# Patient Record
Sex: Male | Born: 1985 | Race: Black or African American | Hispanic: No | Marital: Married | Smoking: Current every day smoker
Health system: Southern US, Community
[De-identification: ages and names within clinical notes are randomized; demographics above are authoritative.]

## PROBLEM LIST (undated history)

## (undated) DIAGNOSIS — E079 Disorder of thyroid, unspecified: Secondary | ICD-10-CM

---
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
## 2005-05-23 ENCOUNTER — Emergency Department (HOSPITAL_COMMUNITY): Admission: EM | Admit: 2005-05-23 | Discharge: 2005-05-23 | Payer: Self-pay | Admitting: Emergency Medicine

## 2006-01-27 ENCOUNTER — Encounter: Admission: RE | Admit: 2006-01-27 | Discharge: 2006-01-27 | Payer: Self-pay | Admitting: Internal Medicine

## 2009-04-29 ENCOUNTER — Emergency Department (HOSPITAL_COMMUNITY): Admission: EM | Admit: 2009-04-29 | Discharge: 2009-04-29 | Payer: Self-pay | Admitting: Emergency Medicine

## 2010-05-25 ENCOUNTER — Observation Stay (HOSPITAL_COMMUNITY): Admission: EM | Admit: 2010-05-25 | Discharge: 2010-05-26 | Payer: Self-pay | Admitting: Emergency Medicine

## 2010-11-30 ENCOUNTER — Encounter: Payer: Self-pay | Admitting: Internal Medicine

## 2011-01-24 LAB — DIFFERENTIAL
Eosinophils Absolute: 0.2 10*3/uL (ref 0.0–0.7)
Lymphs Abs: 1.8 10*3/uL (ref 0.7–4.0)
Monocytes Relative: 5 % (ref 3–12)

## 2011-01-24 LAB — POCT I-STAT, CHEM 8
BUN: 12 mg/dL (ref 6–23)
Calcium, Ion: 1.04 mmol/L — ABNORMAL LOW (ref 1.12–1.32)
Chloride: 105 mEq/L (ref 96–112)
Creatinine, Ser: 1.3 mg/dL (ref 0.4–1.5)
Potassium: 3.5 mEq/L (ref 3.5–5.1)
TCO2: 25 mmol/L (ref 0–100)

## 2011-01-24 LAB — RAPID URINE DRUG SCREEN, HOSP PERFORMED
Amphetamines: NOT DETECTED
Benzodiazepines: NOT DETECTED
Cocaine: NOT DETECTED
Opiates: NOT DETECTED
Tetrahydrocannabinol: POSITIVE — AB

## 2011-01-24 LAB — BASIC METABOLIC PANEL
CO2: 26 mEq/L (ref 19–32)
Calcium: 8.5 mg/dL (ref 8.4–10.5)
GFR calc Af Amer: >60 mL/min (ref >60)
GFR calc non Af Amer: >60 mL/min (ref >60)
Sodium: 138 mEq/L (ref 135–145)

## 2011-01-24 LAB — CBC
Hemoglobin: 14.8 g/dL (ref 13.0–17.0)
Hemoglobin: 15.5 g/dL (ref 13.0–17.0)
RBC: 4.23 MIL/uL (ref 4.22–5.81)
RDW: 12.8 % (ref 11.5–15.5)
WBC: 6.9 10*3/uL (ref 4.0–10.5)
WBC: 7.9 10*3/uL (ref 4.0–10.5)

## 2011-01-24 LAB — PROTIME-INR: INR: 0.92 (ref 0.00–1.49)

## 2011-01-24 LAB — FOLATE RBC: RBC Folate: 624 ng/mL — ABNORMAL HIGH (ref 180–600)

## 2011-01-24 LAB — C-REACTIVE PROTEIN: CRP: 0.3 mg/dL — ABNORMAL LOW (ref <0.6)

## 2011-01-24 LAB — ANA: Anti Nuclear Antibody(ANA): NEGATIVE

## 2011-01-24 LAB — VITAMIN B12 BINDING CAPACITY, BLOOD: Vitamin B12 Bind Capacity: 1245 pg/mL (ref 650–1340)

## 2011-09-28 ENCOUNTER — Emergency Department (HOSPITAL_COMMUNITY)
Admission: EM | Admit: 2011-09-28 | Discharge: 2011-09-29 | Discharge status: Against Medical Advice | Payer: Self-pay | Attending: Emergency Medicine | Admitting: Emergency Medicine

## 2011-09-28 ENCOUNTER — Encounter: Payer: Self-pay | Admitting: *Deleted

## 2011-09-28 DIAGNOSIS — M79609 Pain in unspecified limb: Secondary | ICD-10-CM | POA: Insufficient documentation

## 2011-09-28 DIAGNOSIS — R209 Unspecified disturbances of skin sensation: Secondary | ICD-10-CM | POA: Insufficient documentation

## 2011-09-28 NOTE — ED Notes (Signed)
Pain in his lt foot and some rt sided numbness for one year that comes and goes,  He is currently having numbness only.  No headache

## 2012-01-07 ENCOUNTER — Encounter (HOSPITAL_COMMUNITY): Payer: Self-pay | Admitting: Emergency Medicine

## 2012-01-07 ENCOUNTER — Emergency Department (HOSPITAL_COMMUNITY)
Admission: EM | Admit: 2012-01-07 | Discharge: 2012-01-07 | Disposition: A | Discharge status: Home Health | Payer: Self-pay | Attending: Emergency Medicine | Admitting: Emergency Medicine

## 2012-01-07 DIAGNOSIS — F17200 Nicotine dependence, unspecified, uncomplicated: Secondary | ICD-10-CM | POA: Insufficient documentation

## 2012-01-07 DIAGNOSIS — R5381 Other malaise: Secondary | ICD-10-CM | POA: Insufficient documentation

## 2012-01-07 DIAGNOSIS — R112 Nausea with vomiting, unspecified: Secondary | ICD-10-CM | POA: Insufficient documentation

## 2012-01-07 DIAGNOSIS — K529 Noninfective gastroenteritis and colitis, unspecified: Secondary | ICD-10-CM

## 2012-01-07 DIAGNOSIS — K5289 Other specified noninfective gastroenteritis and colitis: Secondary | ICD-10-CM | POA: Insufficient documentation

## 2012-01-07 DIAGNOSIS — R197 Diarrhea, unspecified: Secondary | ICD-10-CM | POA: Insufficient documentation

## 2012-01-07 DIAGNOSIS — R5383 Other fatigue: Secondary | ICD-10-CM | POA: Insufficient documentation

## 2012-01-07 LAB — POCT I-STAT, CHEM 8
BUN: 12 mg/dL (ref 6–23)
Chloride: 102 mEq/L (ref 96–112)
HCT: 49 % (ref 39.0–52.0)
Sodium: 140 mEq/L (ref 135–145)

## 2012-01-07 LAB — URINALYSIS, ROUTINE W REFLEX MICROSCOPIC
Glucose, UA: NEGATIVE mg/dL
Specific Gravity, Urine: 1.022 (ref 1.005–1.030)
pH: 6 (ref 5.0–8.0)

## 2012-01-07 LAB — URINE MICROSCOPIC-ADD ON

## 2012-01-07 MED ORDER — SODIUM CHLORIDE 0.9 % IV BOLUS (SEPSIS)
1000.0000 mL | Freq: Once | INTRAVENOUS | Status: AC
  Administered 2012-01-07: 1000 mL via INTRAVENOUS

## 2012-01-07 MED ORDER — ONDANSETRON HCL 4 MG PO TABS: 4.0000 mg | ORAL_TABLET | Freq: Four times a day (QID) | ORAL | Status: AC

## 2012-01-07 MED ORDER — ONDANSETRON HCL 4 MG/2ML IJ SOLN
4.0000 mg | Freq: Once | INTRAMUSCULAR | Status: AC
  Administered 2012-01-07: 4 mg via INTRAVENOUS
  Filled 2012-01-07: qty 2

## 2012-01-07 NOTE — ED Provider Notes (Signed)
History     CSN: [id number]  Arrival date & time 01/07/12  1116   First MD Initiated Contact with Patient 01/07/12 1155      Chief Complaint  Patient presents with  . Emesis    (Consider location/radiation/quality/duration/timing/severity/associated sxs/prior treatment) HPI Comments: Patient presents with nausea vomiting diarrhea onset around 6 AM. She greater than 10 episodes of each. Nonbilious nonbloody. He states he ate at [employer] last night. No one else has been sick. Denies any fevers, chest pain, abdominal pain, back pain. He is not able to keep anything down today last vomited 2 hours ago. No other sick contacts.  No dizziness or lightheadedness  The history is provided by the patient.    History reviewed. No pertinent past medical history.  History reviewed. No pertinent past surgical history.  No family history on file.  History  Substance Use Topics  . Smoking status: Current Everyday Smoker  . Smokeless tobacco: Not on file  . Alcohol Use: Yes      Review of Systems  Constitutional: Positive for fatigue. Negative for fever and activity change.  HENT: Negative for congestion and rhinorrhea.   Respiratory: Negative for cough, choking and shortness of breath.   Cardiovascular: Negative for chest pain.  Gastrointestinal: Positive for nausea, vomiting and diarrhea. Negative for abdominal pain.  Genitourinary: Negative for urgency and hematuria.  Musculoskeletal: Negative for back pain.  Skin: Negative for rash.  Neurological: Positive for weakness. Negative for dizziness and headaches.    Allergies  Penicillins  Home Medications  No current outpatient prescriptions on file.  BP 134/77  Pulse 92  Temp(Src) 98.3 F (36.8 C) (Oral)  Resp 20  SpO2 93%  Physical Exam  Constitutional: He is oriented to person, place, and time. He appears well-developed and well-nourished. No distress.  HENT:  Head: Normocephalic and atraumatic.  Mouth/Throat: No  oropharyngeal exudate.  Eyes: Conjunctivae are normal. Pupils are equal, round, and reactive to light.  Neck: Normal range of motion. Neck supple.  Cardiovascular: Normal rate, regular rhythm and normal heart sounds.   Pulmonary/Chest: Effort normal and breath sounds normal. No respiratory distress.  Abdominal: Soft. There is no tenderness. There is no rebound and no guarding.  Musculoskeletal: Normal range of motion. He exhibits no edema and no tenderness.  Neurological: He is alert and oriented to person, place, and time. No cranial nerve deficit.  Skin: Skin is warm.    ED Course  Procedures (including critical care time)  Labs Reviewed  URINALYSIS, ROUTINE W REFLEX MICROSCOPIC - Abnormal; Notable for the following:    Leukocytes, UA SMALL (*)    All other components within normal limits  POCT I-STAT, CHEM 8 - Abnormal; Notable for the following:    Glucose, Bld 108 (*)    All other components within normal limits  URINE MICROSCOPIC-ADD ON   No results found.   No diagnosis found.    MDM  Nausea, vomiting, diarrhea. Vitals stable, abdomen soft, patient nontoxic appearance. Suspect viral gastroenteritis. Orthostatics positive. IV hydration, antiemetics.  Tolerating by mouth. Abdomen soft and nontender.       Glynn Octave, MD 01/07/12 1440

## 2012-01-07 NOTE — ED Notes (Signed)
Patient resting comfortably drank water without incident.

## 2012-01-07 NOTE — ED Notes (Signed)
V/d since 6 am  May have had bad mayo

## 2012-01-07 NOTE — Discharge Instructions (Signed)
B.R.A.T. Diet Your doctor has recommended the B.R.A.T. diet for you or your child until the condition improves. This is often used to help control diarrhea and vomiting symptoms. If you or your child can tolerate clear liquids, you may have:  Bananas.   Rice.   Applesauce.   Toast (and other simple starches such as crackers, potatoes, noodles).  Be sure to avoid dairy products, meats, and fatty foods until symptoms are better. Fruit juices such as apple, grape, and prune juice can make diarrhea worse. Avoid these. Continue this diet for 2 days or as instructed by your caregiver. Document Released: 10/26/2005 Document Revised: 07/08/2011 Document Reviewed: 04/14/2007 [facility] Patient Information 2012 [place], [place].Gastroparesis  Gastroparesis is also called slowed stomach emptying (delayed gastric emptying). It is a condition in which the stomach takes too long to empty its contents. It often happens in people with diabetes.  CAUSES  Gastroparesis happens when nerves to the stomach are damaged or stop working. When the nerves are damaged, the muscles of the stomach and intestines do not work normally. The movement of food is slowed or stopped. High blood glucose (sugar) causes changes in nerves and can damage the blood vessels that carry oxygen and nutrients to the nerves. RISK FACTORS  Diabetes.   Post-viral syndromes.   Eating disorders (anorexia, bulimia).   Surgery on the stomach or vagus nerve.   Gastroesophageal reflux disease (rarely).   Smooth muscle disorders (amyloidosis, scleroderma).   Metabolic disorders, including hypothyroidism.   Parkinson's disease.  SYMPTOMS   Heartburn.   Feeling sick to your stomach (nausea).   Vomiting of undigested food.   An early feeling of fullness when eating.   Weight loss.   Abdominal bloating.   Erratic blood glucose levels.   Lack of appetite.   Gastroesophageal reflux.   Spasms of the stomach wall.  Complications  can include:  Bacterial overgrowth in stomach. Food stays in the stomach and can ferment and cause bacteria to grow.   Weight loss due to difficulty digesting and absorbing nutrients.   Vomiting.   Obstruction in the stomach. Undigested food can harden and cause nausea and vomiting.   Blood glucose fluctuations caused by inconsistent food absorption.  DIAGNOSIS  The diagnosis of gastroparesis is confirmed through one or more of the following tests:  Barium X-rays and scans. These tests look at how long it takes for food to move through the stomach.   Gastric manometry. This test measures electrical and muscular activity in the stomach. A thin tube is passed down the throat into the stomach. The tube contains a wire that takes measurements of the stomach's electrical and muscular activity as it digests liquids and solid food.   Endoscopy. This procedure is done with a long, thin tube called an endoscope. It is passed through the mouth and gently guides down the esophagus into the stomach. This tube helps the caregiver look at the lining of the stomach to check for any abnormalities.   Ultrasound. This can rule out gallbladder disease or pancreatitis. This test will outline and define the shape of the gallbladder and pancreas.  TREATMENT   The primary treatment is to identify the problem and help control blood glucose levels. Treatments include:   Exercise.   Medicines to control nausea and vomiting.   Medicines to stimulate stomach muscles.   Changes in what and when you eat.   Having smaller meals more often.   Eating low-fiber forms of high-fiber foods, such aseating cooked vegetables instead  of raw vegetables.   Eating low-fat foods.   Consuming liquids, which are easier to digest.   In severe cases, feeding tubes and intravenous (IV) feeding may be needed.  It is important to note that in most cases, treatment does not cure gastroparesis. It is usually a lasting (chronic)  condition. Treatment helps you manage the condition so that you can be as healthy and comfortable as possible. NEW TREATMENTS  A gastric neurostimulator has been developed to assist people with gastroparesis. The battery-operated device is surgically implanted. It emits mild electrical pulses to help improve stomach emptying and to control nausea and vomiting.   The use of botulinum toxin has been shown to improve stomach emptying by decreasing the prolonged contractions of the muscle between the stomach and the small intestine (pyloric sphincter). The benefits are temporary.  SEEK MEDICAL CARE IF:   You are having problems keeping your blood glucose in goal range.   You are having nausea, vomiting, bloating, or early feelings of fullness with eating.   Your symptoms do not change with a change in diet.  Document Released: 10/26/2005 Document Revised: 07/08/2011 Document Reviewed: 04/04/2009 [facility] Patient Information 2012 [place], [place].

## 2012-01-07 NOTE — ED Notes (Signed)
Onset today 0400 nausea and emesis x 5-6 last consistency yellow bile.  States nothing left in stomach.  Denies any pain abdomen  Soft distended.  Bowel sounds present in all fields. Ax4.

## 2012-01-07 NOTE — ED Notes (Signed)
Resting comfortably on stretcher 2 family members at bedside.  No report of nausea.

## 2015-04-23 ENCOUNTER — Emergency Department
Admission: EM | Admit: 2015-04-23 | Discharge: 2015-04-23 | Disposition: A | Discharge status: Home / Self Care | Payer: Self-pay | Attending: Emergency Medicine | Admitting: Emergency Medicine

## 2015-04-23 ENCOUNTER — Encounter: Payer: Self-pay | Admitting: Emergency Medicine

## 2015-04-23 DIAGNOSIS — K088 Other specified disorders of teeth and supporting structures: Secondary | ICD-10-CM | POA: Insufficient documentation

## 2015-04-23 DIAGNOSIS — K029 Dental caries, unspecified: Secondary | ICD-10-CM | POA: Insufficient documentation

## 2015-04-23 DIAGNOSIS — Z720 Tobacco use: Secondary | ICD-10-CM | POA: Insufficient documentation

## 2015-04-23 DIAGNOSIS — K0889 Other specified disorders of teeth and supporting structures: Secondary | ICD-10-CM

## 2015-04-23 DIAGNOSIS — Z880 Allergy status to penicillin: Secondary | ICD-10-CM | POA: Insufficient documentation

## 2015-04-23 MED ORDER — LIDOCAINE VISCOUS 2 % MT SOLN: 20.0000 mL | OROMUCOSAL | Status: DC | PRN

## 2015-04-23 MED ORDER — IBUPROFEN 800 MG PO TABS: 800.0000 mg | ORAL_TABLET | Freq: Three times a day (TID) | ORAL | Status: DC | PRN

## 2015-04-23 MED ORDER — HYDROCODONE-ACETAMINOPHEN 5-325 MG PO TABS: 1.0000 | ORAL_TABLET | ORAL | Status: DC | PRN

## 2015-04-23 MED ORDER — SULFAMETHOXAZOLE-TRIMETHOPRIM 800-160 MG PO TABS: 1.0000 | ORAL_TABLET | Freq: Two times a day (BID) | ORAL | Status: DC

## 2015-04-23 MED ORDER — HYDROCODONE-ACETAMINOPHEN 5-325 MG PO TABS
ORAL_TABLET | ORAL | Status: AC
  Administered 2015-04-23: 2 via ORAL
  Filled 2015-04-23: qty 2

## 2015-04-23 MED ORDER — HYDROCODONE-ACETAMINOPHEN 5-325 MG PO TABS
2.0000 | ORAL_TABLET | Freq: Once | ORAL | Status: AC
  Administered 2015-04-23: 2 via ORAL

## 2015-04-23 NOTE — ED Notes (Signed)
Dental pain and gum swelling for couple of days

## 2015-04-23 NOTE — ED Provider Notes (Signed)
[facility] Emergency Department Provider Note  ____________________________________________  Time seen: Approximately 7:15 AM  I have reviewed the triage vital signs and the nursing notes.   HISTORY  Chief Complaint Dental Pain    HPI Trevor Hopkins is a 29 y.o. male presents for emergency room for evaluation of dental pain and gum swelling. Last 2-3 days. Patient states unable to see a dentist [place]. Planes of severe pain 10 over 10 his concern is having an abscess.   History reviewed. No pertinent past medical history.  There are no active problems to display for this patient.   History reviewed. No pertinent past surgical history.  Current Outpatient Rx  Name  Route  Sig  Dispense  Refill  . HYDROcodone-acetaminophen (NORCO) 5-325 MG per tablet   Oral   Take 1-2 tablets by mouth every 4 (four) hours as needed for moderate pain.   15 tablet   0   . ibuprofen (ADVIL,MOTRIN) 800 MG tablet   Oral   Take 1 tablet (800 mg total) by mouth every 8 (eight) hours as needed.   30 tablet   0   . lidocaine (XYLOCAINE) 2 % solution   Mouth/Throat   Use as directed 20 mLs in the mouth or throat as needed for mouth pain.   100 mL   0   . sulfamethoxazole-trimethoprim (BACTRIM DS,SEPTRA DS) 800-160 MG per tablet   Oral   Take 1 tablet by mouth 2 (two) times daily.   20 tablet   0     Allergies Penicillins  No family history on file.  Social History History  Substance Use Topics  . Smoking status: Current Every Day Smoker  . Smokeless tobacco: Not on file  . Alcohol Use: Yes    Review of Systems Constitutional: No fever/chills Eyes: No visual changes. ENT: No sore throat. His dental caries throughout most of the mouth. Some erythema and swelling noted to the right lower jaw. Cardiovascular: Denies chest pain. Respiratory: Denies shortness of breath. Gastrointestinal: No abdominal pain.  No nausea,  no vomiting.  No diarrhea.  No constipation. Genitourinary: Negative for dysuria. Musculoskeletal: Negative for back pain. Skin: Negative for rash. Neurological: Negative for headaches, focal weakness or numbness.  10-point ROS otherwise negative.  ____________________________________________   PHYSICAL EXAM:  VITAL SIGNS: ED Triage Vitals  Enc Vitals Group     BP 04/23/15 0710 145/80 mmHg     Pulse Rate 04/23/15 0710 74     Resp 04/23/15 0710 18     Temp 04/23/15 0710 98.9 F (37.2 C)     Temp Source 04/23/15 0710 Oral     SpO2 04/23/15 0710 98 %     Weight 04/23/15 0710 285 lb (129.275 kg)     Height 04/23/15 0710 6\' 6"  (1.981 m)     Head Cir --      Peak Flow --      Pain Score --      Pain Loc --      Pain Edu? --      Excl. in GC? --     Constitutional: Alert and oriented. Well appearing and in no acute distress. Mouth/Throat: Mucous membranes are moist.  Oropharynx non-erythematous. Obvious dental caries scattered throughout right worsen left. Some erythema and swelling noted to the right lower jaw. Neck: No stridor.   Cardiovascular: Normal rate, regular rhythm. Grossly normal heart sounds.  Good peripheral circulation. Respiratory: Normal respiratory effort.  No  retractions. Lungs CTAB. Musculoskeletal: No lower extremity tenderness nor edema.  No joint effusions. Neurologic:  Normal speech and language. No gross focal neurologic deficits are appreciated. Speech is normal. No gait instability. Skin:  Skin is warm, dry and intact. No rash noted. Psychiatric: Mood and affect are normal. Speech and behavior are normal.  ____________________________________________   LABS (all labs ordered are listed, but only abnormal results are displayed)  Labs Reviewed - No data to  display ____________________________________________  EKG  Deferred ____________________________________________  RADIOLOGY  Deferred ____________________________________________   PROCEDURES  Procedure(s) performed: None  Critical Care performed: No  ____________________________________________   INITIAL IMPRESSION / ASSESSMENT AND PLAN / ED COURSE  Pertinent labs & imaging results that were available during my care of the patient were reviewed by me and considered in my medical decision making (see chart for details).  Acute dental caries. Rx given for Bactrim DS*(secondary to penicillin allergies), ibuprofen, viscous lidocaine, and hydrocodone temporarily. Patient to follow up with list provided dentist. She voices no new emergency medical complaints at this visit and will follow up as directed. ____________________________________________   FINAL CLINICAL IMPRESSION(S) / ED DIAGNOSES  Final diagnoses:  Pain, dental      Evangeline Dakin, PA-C 04/23/15 0740  Myrna Blazer, MD 04/23/15 1420

## 2015-04-23 NOTE — Discharge Instructions (Signed)
OPTIONS FOR DENTAL FOLLOW UP CARE ° °[place] [facility] and [facility] - Local Safety Net Dental Clinics °http://www.ncdhhs.gov/dph/oralhealth/services/safetynetclinics.htm °  °[facility] (336-562-3123) ° °[facility] [place] (919-933-9087) ° °[facility] ([phone] ext [phone]) ° °[facility] ([phone]) ° °[facility] ([phone]) °This clinic caters to the indigent population and is on a lottery system. °Location: °[facility], [address], [place] °Clinic Hours: °Wednesdays from 6pm - 9pm, patients seen by a lottery system. °For dates, call or go to [email or web address] °Services: °Cleanings, fillings and simple extractions. °Payment Options: °DENTAL WORK IS FREE OF CHARGE. Bring proof of income or support. °Best way to get seen: °Arrive at 5:15 pm - this is a lottery, NOT first come/first serve, so arriving earlier will not increase your chances of being seen. °  °  °[facility] °[phone] °Select option 1 for emergencies °  °Location: °[facility], [address], [place] °Clinic Hours: °No walk-ins accepted - call the day before to schedule an appointment. °Check in times are 9:30 am and 1:30 pm. °Services: °Simple extractions, temporary fillings, pulpectomy/pulp debridement, uncomplicated abscess drainage. °Payment Options: °PAYMENT IS DUE AT THE TIME OF SERVICE.  Fee is usually $100-200, additional surgical procedures (e.g. abscess drainage) may be extra. °Cash, checks, Visa/MasterCard accepted.  Can file Medicaid if patient is covered for dental - patient should call case worker to check. °No discount for [facility] patients. °Best way to get seen: °MUST call the day before and get onto the schedule. Can usually be seen the next 1-2 days. No walk-ins accepted. °  °  °[facility] °[phone] °   °Location: °[facility], [address], [place] °Clinic Hours: °M, W, Th, F 8am or 1:30pm, Tues 9a or 1:30 - first come/first served. °Services: °Simple extractions, temporary fillings, uncomplicated abscess drainage.  You do not need to be an [place] resident. °Payment Options: °PAYMENT IS DUE AT THE TIME OF SERVICE. °Dental insurance, otherwise sliding scale - bring proof of income or support. °Depending on income and treatment needed, cost is usually $50-200. °Best way to get seen: °Arrive early as it is first come/first served. °  °  °[facility] °[phone] °  °Location: °[address] °Clinic Hours: °Mon-Thu 8a-5p °Services: °Most basic dental services including extractions and fillings. °Payment Options: °PAYMENT IS DUE AT THE TIME OF SERVICE. °Sliding scale, up to 50% off - bring proof if income or support. °Medicaid with dental option accepted. °Best way to get seen: °Call to schedule an appointment, can usually be seen within 2 weeks OR they will try to see walk-ins - show up at 8a or 2p (you may have to wait). °  °  °[facility] °919-245-2435 °[place] RESIDENTS ONLY °  °Location: °[facility], [address], [place],  [address] °Clinic Hours: By appointment only. °Monday - Thursday 8am-5pm, Friday 8am-12pm °Services: Cleanings, fillings, extractions. °Payment Options: °PAYMENT IS DUE AT THE TIME OF SERVICE. °Cash, Visa or MasterCard. Sliding scale - $30 minimum per service. °Best way to get seen: °Come in to office, complete packet and make an appointment - need proof of income °or support monies for each household member and proof of [place] residence. °Usually takes about a month to get in. °  °  °[facility] °[phone] °  °Location: °[address]   [place] °Clinic Hours: Walk-in Urgent Care Dental Services are offered Monday-Friday  mornings only. °The numbers of emergencies accepted daily is limited to the number of °providers available. °Maximum 15 - Mondays, Wednesdays & Thursdays °Maximum 10 - Tuesdays & Fridays °Services: °You do not need to be a [place] resident to be seen for a dental emergency. °Emergencies are defined as pain, swelling, abnormal bleeding, or dental trauma. Walkins will receive x-rays if needed. °NOTE: Dental cleaning is not an emergency. °Payment Options: °PAYMENT IS DUE AT THE TIME OF SERVICE. °Minimum co-pay is $40.00 for uninsured patients. °Minimum co-pay is $3.00 for Medicaid with dental coverage. °Dental Insurance is accepted and must be presented at time of visit. °Medicare does not cover dental. °Forms of payment: Cash, credit card, checks. °Best way to get seen: °If not previously registered with the clinic, walk-in dental registration begins at 7:15 am and is on a first come/first serve basis. °If previously registered with the clinic, call to make an appointment. °  °  °[facility] °[phone] °[place] RESIDENTS ONLY °  °Location: °[address], [place], [place] °Clinic Hours: °Mon-Thu 10a-2p °Services: Extractions only! °Payment Options: °FREE (donations accepted) - bring proof of income or support °Best way to get seen: °Call and schedule an appointment OR come at 8am on the 1st Monday of every month (except for holidays) when it is first come/first served. °  °  °[facility] °[phone] °  °Location: °[address], [place] °Clinic Hours: °Friday mornings °Services, Payment Options, Best way to get seen: °Call for info °Dental Pain °A tooth ache may be caused by cavities (tooth decay). Cavities expose the nerve of the tooth to air and hot or cold temperatures. It may come from an infection or abscess (also called a boil or furuncle) around your tooth. It is also often caused by dental caries (tooth decay). This causes the pain you are having. °DIAGNOSIS  °Your caregiver can  diagnose this problem by exam. °TREATMENT  °· If caused by an infection, it may be treated with medications which kill germs (antibiotics) and pain medications as prescribed by your caregiver. Take medications as directed. °· Only take over-the-counter or prescription medicines for pain, discomfort, or fever as directed by your caregiver. °· Whether the tooth ache today is caused by infection or dental disease, you should see your dentist as soon as possible for further care. °SEEK MEDICAL CARE IF: °The exam and treatment you received today has been provided on an emergency basis only. This is not a substitute for complete medical or dental care. If your problem worsens or new problems (symptoms) appear, and you are unable to meet with your dentist, call or return to this location. °SEEK IMMEDIATE MEDICAL CARE IF:  °· You have a fever. °· You develop redness and swelling of your face, jaw, or neck. °· You are unable to open your mouth. °· You have severe pain uncontrolled by pain medicine. °MAKE SURE YOU:  °· Understand these instructions. °· Will watch your condition. °· Will get help right away if you are not doing well or get worse. °Document Released: 10/26/2005 Document Revised: 01/18/2012 Document Reviewed: 06/13/2008 °[employer]® Patient Information ©2015 [employer]. This information is not intended to replace advice given to you by your health care provider. Make sure you discuss any questions you have with your health care provider. ° °

## 2015-09-02 ENCOUNTER — Encounter (HOSPITAL_COMMUNITY): Payer: Self-pay | Admitting: Emergency Medicine

## 2015-09-02 ENCOUNTER — Emergency Department (HOSPITAL_COMMUNITY)
Admission: EM | Admit: 2015-09-02 | Discharge: 2015-09-02 | Disposition: A | Discharge status: Home / Self Care | Payer: Self-pay | Attending: Emergency Medicine | Admitting: Emergency Medicine

## 2015-09-02 DIAGNOSIS — Z880 Allergy status to penicillin: Secondary | ICD-10-CM | POA: Insufficient documentation

## 2015-09-02 DIAGNOSIS — Z792 Long term (current) use of antibiotics: Secondary | ICD-10-CM | POA: Insufficient documentation

## 2015-09-02 DIAGNOSIS — Z720 Tobacco use: Secondary | ICD-10-CM | POA: Insufficient documentation

## 2015-09-02 DIAGNOSIS — K0889 Other specified disorders of teeth and supporting structures: Secondary | ICD-10-CM | POA: Insufficient documentation

## 2015-09-02 MED ORDER — IBUPROFEN 400 MG PO TABS
800.0000 mg | ORAL_TABLET | Freq: Once | ORAL | Status: AC
  Administered 2015-09-02: 800 mg via ORAL
  Filled 2015-09-02: qty 2

## 2015-09-02 NOTE — ED Provider Notes (Signed)
CSN: [id number]     Arrival date & time 09/02/15  1856 History  By signing my name below, I, Soijett Blue, attest that this documentation has been prepared under the direction and in the presence of Burna Forts, PA-C Electronically Signed: Soijett Blue, ED Scribe. 09/02/2015. 7:43 PM.   Chief Complaint  Patient presents with  . Dental Pain      The history is provided by the patient. No language interpreter was used.    Trevor Hopkins is a 29 y.o. male who presents to the Emergency Department complaining of 10/10 progressively worsening right lower dental pain onset today. He notes that he has had these symptoms before on 04/23/15 that he was seen at [facility] initially for his symptoms. He reports that he was informed that he had a dental abscess that he did not follow up with a dentist for due to his symptoms resolving. He notes that he was at work tonight when the pain began and he denies trauma/injury to the area. He states that he is having associated symptoms of facial swelling. He states that has not tried any medications for the relief for his symptoms. He denies fever, chills, and any other symptoms. Pt denies allergies to any medications.   History reviewed. No pertinent past medical history. History reviewed. No pertinent past surgical history. History reviewed. No pertinent family history. Social History  Substance Use Topics  . Smoking status: Current Every Day Smoker -- 0.50 packs/day    Types: Cigarettes  . Smokeless tobacco: None  . Alcohol Use: Yes    Review of Systems  Constitutional: Negative for fever.  HENT: Positive for dental problem and facial swelling.       Allergies  Penicillins  Home Medications   Prior to Admission medications   Medication Sig Start Date End Date Taking? Authorizing Provider  HYDROcodone-acetaminophen (NORCO) 5-325 MG per tablet Take 1-2 tablets by mouth every 4 (four) hours as needed for moderate pain. 04/23/15   Charmayne Sheer Beers,  PA-C  ibuprofen (ADVIL,MOTRIN) 800 MG tablet Take 1 tablet (800 mg total) by mouth every 8 (eight) hours as needed. 04/23/15   Charmayne Sheer Beers, PA-C  lidocaine (XYLOCAINE) 2 % solution Use as directed 20 mLs in the mouth or throat as needed for mouth pain. 04/23/15   Charmayne Sheer Beers, PA-C  sulfamethoxazole-trimethoprim (BACTRIM DS,SEPTRA DS) 800-160 MG per tablet Take 1 tablet by mouth 2 (two) times daily. 04/23/15   Charmayne Sheer Beers, PA-C   BP 155/89 mmHg  Pulse 54  Temp(Src) 98.2 F (36.8 C) (Oral)  Resp 24  Ht  (1.981 m)  Wt 285 lb (129.275 kg)  BMI 32.94 kg/m2  SpO2 100% Physical Exam  Constitutional: He is oriented to person, place, and time. He appears well-developed and well-nourished. No distress.  HENT:  Head: Normocephalic.  Mouth/Throat: Uvula is midline, oropharynx is clear and moist and mucous membranes are normal. No oropharyngeal exudate, posterior oropharyngeal edema, posterior oropharyngeal erythema or tonsillar abscesses.  External exam shows no asymmetry of the jaw line or face, no signs of obvious swelling, edema, infection. Full active range of motion of the jaw. Neck is supple with full active range of motion, no tenderness to palpation of the soft tissues  Gumline palpated no obvious signs of infection including warmth, redness, abscess, tenderness. Posterior oropharynx clear with no signs of infection, uvula is midline and rises with phonation, tonsils present and normal in size, symmetrical bilateral, tongue is normal soft touch with full  active range of motion, floor mouth is soft nontender.  Eyes: Conjunctivae are normal. Pupils are equal, round, and reactive to light. Right eye exhibits no discharge. Left eye exhibits no discharge.  Neck: Normal range of motion. Neck supple. No JVD present. No tracheal deviation present. No thyromegaly present.  Pulmonary/Chest: No stridor.  Lymphadenopathy:    He has no cervical adenopathy.  Neurological: He is alert and  oriented to person, place, and time.  Skin: Skin is warm and dry. No rash noted. He is not diaphoretic. No erythema. No pallor.  Psychiatric: He has a normal mood and affect. His behavior is normal. Judgment and thought content normal.  Nursing note and vitals reviewed.   ED Course  Procedures (including critical care time)  Periodontal Exam  NERVE BLOCK Performed by: Thermon LeylandHedges,Rual Vermeer Todd Consent: Verbal consent obtained. Required items: required blood products, implants, devices, and special equipment available Time out: Immediately prior to procedure a "time out" was called to verify the correct patient, procedure, equipment, support staff and site/side marked as required.  Indication: Dental pain  Nerve block body site: right buccal nerve   Preparation: Patient was prepped and draped in the usual sterile fashion. Needle gauge: 24 G Location technique: anatomical landmarks  Local anesthetic: Bupivacaine   Anesthetic total: 1.8 ml  Outcome: pain improved Patient tolerance: Patient tolerated the procedure well with no immediate complications.  DIAGNOSTIC STUDIES: Oxygen Saturation is 97% on RA, nl by my interpretation.    COORDINATION OF CARE: 7:41 PM Discussed treatment plan with pt at bedside which includes dental block, f/u and referral to dentist, and use tylenol/ibuprofen PRN and pt agreed to plan.    Labs Review Labs Reviewed - No data to display  Imaging Review No results found. I have personally reviewed and evaluated these images and lab results as part of my medical decision-making.   EKG Interpretation None      MDM   Final diagnoses:  Pain, dental    Labs:   Imaging:   Consults:   Therapeutics: dental block  Discharge Meds:   Assessment/Plan: Uncomplicated dental pain. Symptoms resolved with dental block. Use ibuprofen/tylenol PRN for pain and referral and f/u with dentist. Return precautions given.    I personally performed the  services described in this documentation, which was scribed in my presence. The recorded information has been reviewed and is accurate.    Eyvonne MechanicJeffrey Burdette Gergely, PA-C 09/04/15 1526  Elwin MochaBlair Walden, MD 09/05/15 [phone]

## 2015-09-02 NOTE — ED Notes (Signed)
Pt c/o dental pain 10/10 on his right lower side, right side on his face looks swollen on assessment, pt denies having fever.

## 2015-09-02 NOTE — Discharge Instructions (Signed)
°Emergency Department Resource Guide °1) Find a Doctor and Pay Out of Pocket °Although you won't have to find out who is covered by your insurance plan, it is a good idea to ask around and get recommendations. You will then need to call the office and see if the doctor you have chosen will accept you as a new patient and what types of options they offer for patients who are self-pay. Some doctors offer discounts or will set up payment plans for their patients who do not have insurance, but you will need to ask so you aren't surprised when you get to your appointment. ° °2) Contact Your Local Health Department °Not all health departments have doctors that can see patients for sick visits, but many do, so it is worth a call to see if yours does. If you don't know where your local health department is, you can check in your phone book. The CDC also has a tool to help you locate your state's health department, and many state websites also have listings of all of their local health departments. ° °3) Find a Walk-in Clinic °If your illness is not likely to be very severe or complicated, you may want to try a walk in clinic. These are popping up all over the country in pharmacies, drugstores, and shopping centers. They're usually staffed by nurse practitioners or physician assistants that have been trained to treat common illnesses and complaints. They're usually fairly quick and inexpensive. However, if you have serious medical issues or chronic medical problems, these are probably not your best option. ° °No Primary Care Doctor: °- Call [facility] at  832-8000 - they can help you locate a primary care doctor that  accepts your insurance, provides certain services, etc. °- Physician Referral Service- 1-800-533-3463 ° °Chronic Pain Problems: °Organization         Address  Phone   Notes  ° [facility]  (336) 297-2271 Patients need to be referred by their primary care doctor.  ° °Medication  Assistance: °Organization         Address  Phone   Notes  °[place] [facility] [address] °[place], [place] [address] (336) 641-8030 --Must be a resident of [place] °-- Must have NO insurance coverage whatsoever (no Medicaid/ Medicare, etc.) °-- The pt. MUST have a primary care doctor that directs their care regularly and follows them in the community °  °[facility]  (866) 331-1348   °[employer] Way  (888) 892-1162   ° °Agencies that provide inexpensive medical care: °Organization         Address  Phone   Notes  °[facility]  (336) 832-8035   °[facility]    (336) 832-7272   °[facility] [address] °[place], [place] [address] (336) 832-4777   °[facility] [address], °[place] (336) 271-4999   °[facility]    (336) 373-0678   °[facility]    (336) 272-1050   °[facility] ° [address], [place] Phone:  (336) 832-4444, Fax:  (336) 832-4440 Hours of Operation:  9 am - 6 pm, M-F.  Also accepts Medicaid/Medicare and self-pay.  °[facility] ° [address], [place] Phone: (336) 832-3150, Fax: (336) 832-3151. Hours of Operation:  8:30 am - 5:30 pm, M-F.  Also accepts Medicaid and self-pay.  °[facility] [place] [address], [place] Phone: (336) 878-6027   °[facility] [address], [place], [place] (336)723-1848, Ext. 123 Mondays & Thursdays: 7-9 AM.  First 15 patients are seen on a first come, first serve basis. °  ° °Medicaid-accepting [place] Providers: ° °Organization         Address  Phone   Notes  °[facility] [address], [place] (336) 641-2100 Also accepts self-pay patients.  °[facility] [address], [place] ° (336) 856-9996   °[facility] [address], [place]  (336) 288-8857   °[facility] [address] [place] [address], [place] (336) 299-7000   °Ve[facility] [address], [place]  ° (336) 373-1557 Only accepts [place] Access Medicaid patients after they have their name applied to their card.  ° °Self-Pay (no insurance) in [place]: ° °Organization         Address  Phone   Notes  °Sickle Cell Patients, [facility] [address], [place] (336) 832-1970   °[facility] [address], [place] (336) 832-4400   °[facility] ° [address], [place] (336) 992-4800   °[facility]/Dr. Osei-Bonsu ° [address] [place] [address], [place] or [address], [place] (336) 841-8500 Phone number for both [place] and [place] locations is the same.  °[facility] [address], [place] (336) 299-0000   °[facility] [address] [place] [address], [place] or [address] (336) 852-7530 °(336) 878-2260   °[facility] [address], [place] (336) 350-1642, phone; (336) 294-5005, fax Sees patients 1st and 3rd Saturday of every month.  Must not qualify for public or private insurance (i.e. Medicaid, Medicare, [place] Health Choice, Veterans' Benefits) • Household income should be no more than 200% of the poverty level •The clinic cannot treat you if you are pregnant or think you are pregnant • Sexually transmitted diseases are not treated at the clinic.  ° ° °Dental Care: °Organization         Address  Phone  Notes  °[place] [facility] [address], [place] (336) 641-6152 Accepts children up to age 21 who are enrolled in Medicaid or [place] Health Choice; pregnant women with a Medicaid card; and children who have applied for Medicaid or [place] Health Choice, but were declined, whose parents can pay a reduced fee at time of service.  °[place]  [facility] [place]  [address], [place] (336) 641-7733 Accepts children up to age 21 who are enrolled in Medicaid or [place] Health Choice; pregnant women with a Medicaid card; and children who have applied for Medicaid or [place] Health Choice, but were declined, whose parents can pay a reduced fee at time of service.  °[facility] ° [address],  (336) 641-4533 Patients are seen by appointment only. Walk-ins are not accepted. [facility] will see patients 18 years of age and older. °Monday - Tuesday (8am-5pm) °Most Wednesdays (8:30-5pm) °$30 per visit, cash only  °[facility] ° [address], [place] (336) 641-4533 Patients are seen by appointment only. Walk-ins are not accepted. [facility] will see patients 18 years of age and older. °One   Wednesday Evening (Monthly: Volunteer Based).  $30 per visit, cash only  °[facility]  (919) 537-3737 for adults; Children under age 4, call Graduate Pediatric Dentistry at (919) 537-3956. Children aged 4-14, please call (919) 537-3737 to request a pediatric application. ° Dental services are provided in all areas of dental care including fillings, crowns and bridges, complete and partial dentures, implants, gum treatment, root canals, and extractions. Preventive care is also provided. Treatment is provided to both adults and children. °Patients are selected via a lottery and there is often a waiting list. °  °[facility] [address], °[place] ° (336) 763-8833 [email or web address] °  °[facility] [address], [place], [place] (336)723-1848, Ext. 123 Second and Fourth Thursday of each month, opens at 6:30 AM; Clinic ends at 9 AM.  Patients are seen on a first-come first-served basis, and a limited number are seen during each clinic.  ° °[facility] ° [address], [place], [place] (336) 723-7904    Eligibility Requirements °You must have lived in [place], [place], or [place] counties for at least the last three months. °  You cannot be eligible for state or federal sponsored healthcare insurance, including Veterans Administration, Medicaid, or Medicare. °  You generally cannot be eligible for healthcare insurance through your employer.  °  How to apply: °Eligibility screenings are held every Tuesday and Wednesday afternoon from 1:00 pm until 4:00 pm. You do not need an appointment for the interview!  °[facility] [address], [place], [place] 336-631-2330   °[facility]  336-342-8273   °[facility]  336-703-3100   °[facility]  336-570-6415   ° °Behavioral Health Resources in the Community: °Intensive Outpatient Programs °Organization         Address  Phone  Notes  °[facility] [address], [place], [place] 336-878-6098   °[facility] [address], [place], [place] 336-832-9800   °[facility] [address], [place], [place] ° 336-882-2125   °[facility] [address],  °[place], [place] 1-800-853-5163 or 336-641-4981   °Substance Abuse Resources °Organization         Address  Phone  Notes  °[facility]  336-882-2125   °[facility]  336-784-9470   °[facility]  336-285-9073   °[facility]  336-845-3988   °Residential & Outpatient Substance Abuse Program  1-800-659-3381   °Psychological Services °Organization         Address  Phone  Notes  °[facility]  336- 832-9600   °[facility]  336- 378-7881   °[facility] [address], [place] 1-800-853-5163 or 336-641-4981   ° °Mobile Crisis Teams °Organization         Address  Phone  Notes  °[facility], Mobile Crisis Care Unit  1-877-626-1772   °Assertive °[facility] ° [address]  [place], [place] 336-834-9664   °Sharon DeEsch [address] °[place] 336-554-5454   ° °Self-Help/Support Groups °Organization         Address  Phone             Notes  °[facility] [place] - variety of support groups  336- 373-1402 Call for more information  °Narcotics Anonymous (NA), [facility] [address], °[place]  2 meetings at this location  ° °  Residential Treatment Programs °Organization         Address  Phone  Notes  °[facility] [address],    °[place]  1-866-801-8205   °[facility] ° [address], [place], [place] 704-293-8524   °[facility] [address], [place] 336-845-3988 Admissions: 8am-3pm M-F  °[facility] [address]    °[place], [place] 336-841-1104   °[facility] [address], [place], [place] 336-379-7146   °[facility] [address]  °[place], [place] 336-285-9073   °[facility] - Intensive Outpatient [address], [place], [place] 336-852-3033   °[facility] [address]  °[place], [place] 1-877-615-2722 or 336-784-9470   °[facility] [address] [place], [place] 336-227-7417 Accepts Medicaid  °[facility] [address]  °[place] 1-800-659-3381 Substance Abuse/Addiction Treatment  ° °[place] Behavioral Health Resources °Organization         Address  Phone  Notes  °[facility]  (888) 581-9988   °Julie Brannon, PhD [address] [place], [place]   (336) 349-5553 or (336) 951-0000   °[facility]   [address] °[place], [place] (336) 349-4454   °[facility] [address], [place], [place] (336) 342-8316 Insurance/Medicaid/sponsorship through [facility]  °[facility] [address]                                    [place], [place] (336) 342-8316 Therapy/tele-psych/case    °[facility] [address]  ° [place], [place] (336) 349-2233    °Dr. Arfeen  (336) 349-4544   °[facility] [place]  [employer] [place] [facility] 1) [address], [place] °2) [address], [place] °3)  [address], [place] (336) 349-3220 °(336) 342-7768 ° °(336) 342-8140   °[place] Child Abuse [facility] (336) 342-1394 or (336) 342-3537 (After Hours)    ° ° °

## 2016-01-28 ENCOUNTER — Encounter (HOSPITAL_COMMUNITY): Payer: Self-pay | Admitting: Emergency Medicine

## 2016-01-28 ENCOUNTER — Emergency Department (HOSPITAL_COMMUNITY)
Admission: EM | Admit: 2016-01-28 | Discharge: 2016-01-28 | Disposition: A | Discharge status: Home / Self Care | Payer: Self-pay | Attending: Emergency Medicine | Admitting: Emergency Medicine

## 2016-01-28 DIAGNOSIS — Z79899 Other long term (current) drug therapy: Secondary | ICD-10-CM | POA: Insufficient documentation

## 2016-01-28 DIAGNOSIS — K121 Other forms of stomatitis: Secondary | ICD-10-CM | POA: Insufficient documentation

## 2016-01-28 DIAGNOSIS — F17210 Nicotine dependence, cigarettes, uncomplicated: Secondary | ICD-10-CM | POA: Insufficient documentation

## 2016-01-28 DIAGNOSIS — Z791 Long term (current) use of non-steroidal anti-inflammatories (NSAID): Secondary | ICD-10-CM | POA: Insufficient documentation

## 2016-01-28 DIAGNOSIS — Z880 Allergy status to penicillin: Secondary | ICD-10-CM | POA: Insufficient documentation

## 2016-01-28 DIAGNOSIS — Z8639 Personal history of other endocrine, nutritional and metabolic disease: Secondary | ICD-10-CM | POA: Insufficient documentation

## 2016-01-28 HISTORY — DX: Disorder of thyroid, unspecified: E07.9

## 2016-01-28 MED ORDER — MORPHINE SULFATE 15 MG PO TABS: 15.0000 mg | ORAL_TABLET | ORAL | Status: DC | PRN

## 2016-01-28 MED ORDER — NAPROXEN 375 MG PO TABS: 375.0000 mg | ORAL_TABLET | Freq: Two times a day (BID) | ORAL | Status: DC

## 2016-01-28 MED ORDER — MAGIC MOUTHWASH W/LIDOCAINE: 5.0000 mL | Freq: Three times a day (TID) | ORAL | Status: DC

## 2016-01-28 NOTE — ED Provider Notes (Signed)
CSN: [id number]     Arrival date & time 01/28/16  [id number] History  By signing my name below, I, [name], attest that this documentation has been prepared under the direction and in the presence of non-physician practitioner, Arthor CaptainAbigail Kinslea Frances, PA-C. Electronically Signed: [name], Scribe. 01/28/2016. 9:48 AM.    Chief Complaint  Patient presents with  . Dental Pain   The history is provided by the patient. No language interpreter was used.    HPI Comments:  Trevor Hopkins is a 30 y.o. male who presents to the Emergency Department complaining of 8/10 left lower and upper dental pain with associated surrounding swelling x 3 days. He reports greater pain to the left upper gumline.  He has been applying ice to the site with mild relief. He was seen by a dentist ~ 1 year ago for similar pain on the right side. He was told he needed to have a tooth extracted but did not follow up. Pt is a current everyday smoker.    Past Medical History  Diagnosis Date  . Thyroid disease    History reviewed. No pertinent past surgical history. No family history on file. Social History  Substance Use Topics  . Smoking status: Current Every Day Smoker -- 0.50 packs/day    Types: Cigarettes  . Smokeless tobacco: None  . Alcohol Use: Yes    Review of Systems  Constitutional: Negative for fever and chills.  HENT: Positive for dental problem and facial swelling.     Allergies  Penicillins  Home Medications   Prior to Admission medications   Medication Sig Start Date End Date Taking? Authorizing Provider  HYDROcodone-acetaminophen (NORCO) 5-325 MG per tablet Take 1-2 tablets by mouth every 4 (four) hours as needed for moderate pain. 04/23/15   Charmayne Sheerharles M Beers, PA-C  ibuprofen (ADVIL,MOTRIN) 800 MG tablet Take 1 tablet (800 mg total) by mouth every 8 (eight) hours as needed. 04/23/15   Charmayne Sheerharles M Beers, PA-C  lidocaine (XYLOCAINE) 2 % solution Use as directed 20 mLs in the mouth or throat as needed  for mouth pain. 04/23/15   Evangeline Dakinharles M Beers, PA-C  magic mouthwash w/lidocaine SOLN Take 5 mLs by mouth 3 (three) times daily. Swish and swallow. 01/28/16   Arthor CaptainAbigail Harsimran Westman, PA-C  morphine (MSIR) 15 MG tablet Take 1 tablet (15 mg total) by mouth every 4 (four) hours as needed for severe pain. 01/28/16   Arthor CaptainAbigail Aaralyn Kil, PA-C  naproxen (NAPROSYN) 375 MG tablet Take 1 tablet (375 mg total) by mouth 2 (two) times daily. 01/28/16   Arthor CaptainAbigail Noella Kipnis, PA-C  sulfamethoxazole-trimethoprim (BACTRIM DS,SEPTRA DS) 800-160 MG per tablet Take 1 tablet by mouth 2 (two) times daily. 04/23/15   Charmayne Sheerharles M Beers, PA-C   BP 153/88 mmHg  Pulse 71  Temp(Src) 97.3 F (36.3 C) (Oral)  Resp 18  SpO2 96% Physical Exam  Constitutional: He is oriented to person, place, and time. He appears well-developed and well-nourished. No distress.  HENT:  Head: Normocephalic and atraumatic.  Left upper apex of gumline is erythematous and irritated; no swelling   Eyes: Conjunctivae are normal.  Cardiovascular: Normal rate.   Pulmonary/Chest: Effort normal.  Abdominal: He exhibits no distension.  Neurological: He is alert and oriented to person, place, and time.  Skin: Skin is warm and dry.  Psychiatric: He has a normal mood and affect.  Nursing note and vitals reviewed.    ED Course  Procedures   DIAGNOSTIC STUDIES:  Oxygen Saturation is 96% on RA, normal  by my interpretation.    COORDINATION OF CARE:  9:46 AM Discussed treatment plan with pt at bedside and pt agreed to plan.   MDM   Patient with mucositis.  No abscess requiring immediate incision and drainage.  Exam not concerning for Ludwig's angina or pharyngeal abscess.  Will treat with Magic mouthwash and a small amount of pain medication. Pt instructed to follow-up with dentist.  Discussed return precautions. Pt safe for discharge.  Final diagnoses:  Recurrent stomatitis      I personally performed the services described in this documentation, which was  scribed in my presence. The recorded information has been reviewed and is accurate.       Arthor Captain, PA-C 01/29/16 1647  Laurence Spates, MD 01/30/16 [phone]

## 2016-01-28 NOTE — ED Notes (Signed)
Pt report left side mouth pain x 3 days with worsening swelling. Pt alert x4. NAD at this time.

## 2016-01-28 NOTE — Discharge Instructions (Signed)
Stomatitis Stomatitis is a condition that causes inflammation in your mouth. It can affect a part of your mouth or your whole mouth. The condition often affects your cheek, teeth, gums, lips, and tongue. Stomatitis can also affect the mucous membranes that surround your mouth (mucosa). Pain from stomatitis can make it hard for you to eat or drink. Severe cases of this condition can lead to dehydration or poor nutrition. CAUSES Common causes of this condition include:  Viruses, such as cold sores or oral herpes and shingles.  Canker sores.  Bacterial infections.  Fungus or yeast infections, such as oral thrush.  Not getting adequate nutrition.  Injury to your mouth. This can be from:  Dentures or braces that do not fit well.  Biting your tongue or cheek.  Burning your mouth.  Having sharp or broken teeth.  Gum disease.  Using tobacco, especially chewing tobacco.  Allergies to foods, medicines, or substances that are used in your mouth.  Medicines, including cancer medicines (chemotherapy), antihistamines, and seizure medicines. In some cases, the cause may not be known. RISK FACTORS This condition is more likely to develop in people who:  Have poor oral hygiene or poor nutrition.  Have any condition that causes a dry mouth.  Are under a lot of physical or emotional stress.  Have any condition that weakens the body's defense system (immune system).  Are being treated for cancer.  Smoke. SYMPTOMS The most common symptoms of this condition are pain, swelling, and redness inside your mouth. The pain may feel like burning or stinging. It may get worse from eating or drinking. Other symptoms include:  Painful, shallow sores (ulcers) in the mouth.  Blisters in the mouth.  Bleeding gums.  Swollen gums.  Irritability and fatigue.  Bad breath.  Bad taste in the mouth.  Fever. DIAGNOSIS This condition is diagnosed with a physical exam to check for bleeding gums  and mouth ulcers. You may also have other tests, including:  Blood tests to look for infection or vitamin deficiencies.  Mouth swab to get a fluid sample to test for bacteria (culture).  Tissue sample from an ulcer to examine under a microscope (biopsy). TREATMENT Treatment for stomatitis depends on the cause. Treatment may include medicines, such as:  Over-the counter (OTC) pain medicines.  Topical anesthetic to numb the area if you have severe pain.  Antibiotics to treat a bacterial infection.  Antifungals to treat a fungal infection.  Antivirals to treat a viral infection.  Mouth rinses that contain steroids to reduce the swelling in your mouth.  Other medicines to coat or numb your mouth. HOME CARE INSTRUCTIONS Medicines  Take medicines only as directed by your health care provider.  If you were prescribed an antibiotic, finish all of it even if you start to feel better. Lifestyle  Practice good oral hygiene:  Gently brush your teeth with a soft, nylon-bristled toothbrush two times each day.  Floss your teeth every day.  Have your teeth cleaned regularly, as recommended by your dentist.  Eat a balanced diet.Do not eat:  Spicy foods.  Citrus, such as oranges.  Foods that have sharp edges, such as chips.  Avoid any foods or other allergens that you think may be causing your stomatitis.  If you have dentures, make sure that they are properly fitted.  Do not use any tobacco products, including cigarettes, chewing tobacco, or electronic cigarettes. If you need help quitting, ask your health care provider.  Find ways to reduce stress. Try yoga  or meditation. Ask your health care provider for other ideas. General Instructions  Use a salt-water rinse for pain as directed by your health care provider. Mix 1 tsp of salt in 2 cups of water.  Drink enough fluid to keep your urine clear or pale yellow. This will keep you hydrated. SEEK MEDICAL CARE IF:  Your  symptoms get worse.  You develop new symptoms, especially:  A rash.  New symptoms that do not involve your mouth area.  Your symptoms last longer than three weeks.  Your stomatitis goes away and then returns.  You have a harder time eating and drinking normally.  You have increasing fatigue or weakness.  You lose your appetite or you feel nauseous.  You have a fever.   This information is not intended to replace advice given to you by your health care provider. Make sure you discuss any questions you have with your health care provider.   Document Released: 08/23/2007 Document Revised: 03/12/2015 Document Reviewed: 10/22/2014 Elsevier Interactive Patient Education 2016 Elsevier Inc.  Dental Care and Dentist Visits Dental care supports good overall health. Regular dental visits can also help you avoid dental pain, bleeding, infection, and other more serious health problems in the future. It is important to keep the mouth healthy because diseases in the teeth, gums, and other oral tissues can spread to other areas of the body. Some problems, such as diabetes, heart disease, and pre-term labor have been associated with poor oral health.  See your dentist every 6 months. If you experience emergency problems such as a toothache or broken tooth, go to the dentist right away. If you see your dentist regularly, you may catch problems early. It is easier to be treated for problems in the early stages.  WHAT TO EXPECT AT A DENTIST VISIT  Your dentist will look for many common oral health problems and recommend proper treatment. At your regular dental visit, you can expect:  Gentle cleaning of the teeth and gums. This includes scraping and polishing. This helps to remove the sticky substance around the teeth and gums (plaque). Plaque forms in the mouth shortly after eating. Over time, plaque hardens on the teeth as tartar. If tartar is not removed regularly, it can cause problems. Cleaning also  helps remove stains.  Periodic X-rays. These pictures of the teeth and supporting bone will help your dentist assess the health of your teeth.  Periodic fluoride treatments. Fluoride is a natural mineral shown to help strengthen teeth. Fluoride treatmentinvolves applying a fluoride gel or varnish to the teeth. It is most commonly done in children.  Examination of the mouth, tongue, jaws, teeth, and gums to look for any oral health problems, such as:  Cavities (dental caries). This is decay on the tooth caused by plaque, sugar, and acid in the mouth. It is best to catch a cavity when it is small.  Inflammation of the gums caused by plaque buildup (gingivitis).  Problems with the mouth or malformed or misaligned teeth.  Oral cancer or other diseases of the soft tissues or jaws. KEEP YOUR TEETH AND GUMS HEALTHY For healthy teeth and gums, follow these general guidelines as well as your dentist's specific advice:  Have your teeth professionally cleaned at the dentist every 6 months.  Brush twice daily with a fluoride toothpaste.  Floss your teeth daily.  Ask your dentist if you need fluoride supplements, treatments, or fluoride toothpaste.  Eat a healthy diet. Reduce foods and drinks with added sugar.  Avoid  smoking. TREATMENT FOR ORAL HEALTH PROBLEMS If you have oral health problems, treatment varies depending on the conditions present in your teeth and gums.  Your caregiver will most likely recommend good oral hygiene at each visit.  For cavities, gingivitis, or other oral health disease, your caregiver will perform a procedure to treat the problem. This is typically done at a separate appointment. Sometimes your caregiver will refer you to another dental specialist for specific tooth problems or for surgery. SEEK IMMEDIATE DENTAL CARE IF:  You have pain, bleeding, or soreness in the gum, tooth, jaw, or mouth area.  A permanent tooth becomes loose or separated from the gum  socket.  You experience a blow or injury to the mouth or jaw area.   This information is not intended to replace advice given to you by your health care provider. Make sure you discuss any questions you have with your health care provider.   Document Released: 07/08/2011 Document Revised: 01/18/2012 Document Reviewed: 07/08/2011 Elsevier Interactive Patient Education 2016 Elsevier Inc.  [facility]  [employer] [facility] [employer]  Community Service Learning [facility]  [address]  [place], [place] [address]  Phone [phone]  The [employer] [facility] [place] exemplifies the [employer] vision to improve the health and quality of life of all [place] by Public house managercreating leaders with a passion to care for the underserved and by leading the nation in community-based, service learning oral health education. We are committed to offering comprehensive general dental services for adults, children and special needs patients in a safe, caring and professional setting.  Appointments: Our clinic is open Monday through Friday 8:00 a.m. until 5:00 p.m. The amount of time scheduled for an appointment depends on the patients specific needs. We ask that you keep your appointed time for care or provide 24-hour notice of all appointment changes. Parents or legal guardians must accompany minor children.  Payment for Services: Medicaid and other insurance plans are welcome. Payment for services is due when services are rendered and may be made by cash or credit card. If you have dental insurance, we will assist you with your claim submission.   Emergencies: Emergency services will be provided Monday through Friday on a walk-in basis. Please arrive early for emergency services. After hours emergency services will be provided for patients of record as required.  Services:  [facility]  Childrens Dentistry  Oral Surgery - Extractions  Root Canals  Sealants and Tooth Colored Fillings  Crowns and Bridges  Dentures and Partial Dentures  Implant Services  Periodontal Services and Cleanings  Cosmetic Building services engineerTooth Whitening  Digital Radiography  3-D/[facility] Beam Imaging

## 2016-04-02 ENCOUNTER — Encounter (HOSPITAL_COMMUNITY): Payer: Self-pay | Admitting: Emergency Medicine

## 2016-04-02 ENCOUNTER — Emergency Department (HOSPITAL_COMMUNITY)
Admission: EM | Admit: 2016-04-02 | Discharge: 2016-04-02 | Disposition: A | Discharge status: Home / Self Care | Payer: Self-pay | Attending: Emergency Medicine | Admitting: Emergency Medicine

## 2016-04-02 DIAGNOSIS — K029 Dental caries, unspecified: Secondary | ICD-10-CM | POA: Insufficient documentation

## 2016-04-02 DIAGNOSIS — Z79899 Other long term (current) drug therapy: Secondary | ICD-10-CM | POA: Insufficient documentation

## 2016-04-02 DIAGNOSIS — T85848A Pain due to other internal prosthetic devices, implants and grafts, initial encounter: Secondary | ICD-10-CM

## 2016-04-02 DIAGNOSIS — T8484XA Pain due to internal orthopedic prosthetic devices, implants and grafts, initial encounter: Secondary | ICD-10-CM | POA: Insufficient documentation

## 2016-04-02 DIAGNOSIS — Z791 Long term (current) use of non-steroidal anti-inflammatories (NSAID): Secondary | ICD-10-CM | POA: Insufficient documentation

## 2016-04-02 DIAGNOSIS — Y658 Other specified misadventures during surgical and medical care: Secondary | ICD-10-CM | POA: Insufficient documentation

## 2016-04-02 DIAGNOSIS — Z8639 Personal history of other endocrine, nutritional and metabolic disease: Secondary | ICD-10-CM | POA: Insufficient documentation

## 2016-04-02 DIAGNOSIS — Z880 Allergy status to penicillin: Secondary | ICD-10-CM | POA: Insufficient documentation

## 2016-04-02 DIAGNOSIS — Z792 Long term (current) use of antibiotics: Secondary | ICD-10-CM | POA: Insufficient documentation

## 2016-04-02 DIAGNOSIS — F17210 Nicotine dependence, cigarettes, uncomplicated: Secondary | ICD-10-CM | POA: Insufficient documentation

## 2016-04-02 DIAGNOSIS — K0381 Cracked tooth: Secondary | ICD-10-CM | POA: Insufficient documentation

## 2016-04-02 MED ORDER — OXYCODONE HCL 5 MG PO TABS
10.0000 mg | ORAL_TABLET | Freq: Once | ORAL | Status: AC
  Administered 2016-04-02: 10 mg via ORAL
  Filled 2016-04-02: qty 2

## 2016-04-02 MED ORDER — TRAMADOL HCL 50 MG PO TABS: 50.0000 mg | ORAL_TABLET | Freq: Two times a day (BID) | ORAL | Status: DC | PRN

## 2016-04-02 NOTE — Discharge Instructions (Signed)
Dental Pain Trevor Hopkins, see your dentist within 3 days for close follow up of your teeth.  Continue tylenol or ibuprofen at home for pain control.  You can take tramadol for severe pain.  Come back to the ED immediately for any worsening symptoms.  Thank you. Dental pain may be caused by many things, including:  Tooth decay (cavities or caries). Cavities cause the nerve of your tooth to be open to air and hot or cold temperatures. This can cause pain or discomfort.  Abscess or infection. A dental abscess is an area that is full of infected pus from a bacterial infection in the inner part of the tooth (pulp). It usually happens at the end of the tooth's root.  Injury.  An unknown reason (idiopathic). Your pain may be mild or severe. It may only happen when:  You are chewing.  You are exposed to hot or cold temperature.  You are eating or drinking sugary foods or beverages, such as:  Soda.  Candy. Your pain may also be there all of the time. HOME CARE Watch your dental pain for any changes. Do these things to lessen your discomfort:  Take medicines only as told by your dentist.  If your dentist tells you to take an antibiotic medicine, finish all of it even if you start to feel better.  Keep all follow-up visits as told by your dentist. This is important.  Do not apply heat to the outside of your face.  Rinse your mouth or gargle with salt water if told by your dentist. This helps with pain and swelling.  You can make salt water by adding  tsp of salt to 1 cup of warm water.  Apply ice to the painful area of your face:  Put ice in a plastic bag.  Place a towel between your skin and the bag.  Leave the ice on for 20 minutes, 2-3 times per day.  Avoid foods or drinks that cause you pain, such as:  Very hot or very cold foods or drinks.  Sweet or sugary foods or drinks. GET HELP IF:  Your pain is not helped with medicines.  Your symptoms are worse.  You have new  symptoms. GET HELP RIGHT AWAY IF:  You cannot open your mouth.  You are having trouble breathing or swallowing.  You have a fever.  Your face, neck, or jaw is puffy (swollen).   This information is not intended to replace advice given to you by your health care provider. Make sure you discuss any questions you have with your health care provider.   Document Released: 04/13/2008 Document Revised: 03/12/2015 Document Reviewed: 10/22/2014 Elsevier Interactive Patient Education [employer]2016 Elsevier Inc.

## 2016-04-02 NOTE — ED Notes (Addendum)
Pt. reports worsening right lower molar pain onset this week unrelieved by OTC Ibuprofen .

## 2016-04-02 NOTE — ED Provider Notes (Signed)
CSN: [id number]     Arrival date & time 04/02/16  0319 History  By signing my name below, I, Bethel BornBritney McCollum, attest that this documentation has been prepared under the direction and in the presence of Tomasita CrumbleAdeleke Ryla Cauthon, MD. Electronically Signed: Bethel BornBritney McCollum, ED Scribe. 04/02/2016. 3:48 AM   Chief Complaint  Patient presents with  . Dental Pain    The history is provided by the patient. No language interpreter was used.   Trevor Hopkins is a 30 y.o. male who presents to the Emergency Department complaining of constant, progressively worsening, 10/10 in severity, right lower dental pain with onset yesterday. Pt states that it feels as if the tooth is loose. Ibuprofen provided insufficient pain relief at home. He had similar pain at a different tooth last month that improved with abx.  Pt denies fever, chills, and difficulty breathing or swallowing. He does have a dentist to follow up with.   Past Medical History  Diagnosis Date  . Thyroid disease    History reviewed. No pertinent past surgical history. No family history on file. Social History  Substance Use Topics  . Smoking status: Current Every Day Smoker -- 0.00 packs/day    Types: Cigarettes  . Smokeless tobacco: None  . Alcohol Use: Yes    Review of Systems  10 Systems reviewed and all are negative for acute change except as noted in the HPI.  Allergies  Penicillins  Home Medications   Prior to Admission medications   Medication Sig Start Date End Date Taking? Authorizing Provider  HYDROcodone-acetaminophen (NORCO) 5-325 MG per tablet Take 1-2 tablets by mouth every 4 (four) hours as needed for moderate pain. 04/23/15   Charmayne Sheerharles M Beers, PA-C  ibuprofen (ADVIL,MOTRIN) 800 MG tablet Take 1 tablet (800 mg total) by mouth every 8 (eight) hours as needed. 04/23/15   Charmayne Sheerharles M Beers, PA-C  lidocaine (XYLOCAINE) 2 % solution Use as directed 20 mLs in the mouth or throat as needed for mouth pain. 04/23/15   Evangeline Dakinharles M Beers, PA-C   magic mouthwash w/lidocaine SOLN Take 5 mLs by mouth 3 (three) times daily. Swish and swallow. 01/28/16   Arthor CaptainAbigail Harris, PA-C  morphine (MSIR) 15 MG tablet Take 1 tablet (15 mg total) by mouth every 4 (four) hours as needed for severe pain. 01/28/16   Arthor CaptainAbigail Harris, PA-C  naproxen (NAPROSYN) 375 MG tablet Take 1 tablet (375 mg total) by mouth 2 (two) times daily. 01/28/16   Arthor CaptainAbigail Harris, PA-C  sulfamethoxazole-trimethoprim (BACTRIM DS,SEPTRA DS) 800-160 MG per tablet Take 1 tablet by mouth 2 (two) times daily. 04/23/15   Charmayne Sheerharles M Beers, PA-C   BP 148/82 mmHg  Pulse 92  Temp(Src) 97.6 F (36.4 C) (Oral)  Resp 18  Wt 280 lb (127.007 kg)  SpO2 100% Physical Exam  Constitutional: He is oriented to person, place, and time. Vital signs are normal. He appears well-developed and well-nourished.  Non-toxic appearance. He does not appear ill. No distress.  HENT:  Head: Normocephalic and atraumatic.  Nose: Nose normal.  Mouth/Throat: Oropharynx is clear and moist. No oropharyngeal exudate.  Poor dentition overall, multiple chipped teeth and dental caries.  Tooth #28 where patient has pain does not reveal any swelling or TTP  Eyes: Conjunctivae and EOM are normal. Pupils are equal, round, and reactive to light. No scleral icterus.  Neck: Normal range of motion. Neck supple. No tracheal deviation, no edema, no erythema and normal range of motion present. No thyroid mass and no thyromegaly present.  Cardiovascular: Normal rate, regular rhythm, S1 normal, S2 normal, normal heart sounds, intact distal pulses and normal pulses.  Exam reveals no gallop and no friction rub.   No murmur heard. Pulmonary/Chest: Effort normal and breath sounds normal. No respiratory distress. He has no wheezes. He has no rhonchi. He has no rales.  Abdominal: Soft. Normal appearance and bowel sounds are normal. He exhibits no distension, no ascites and no mass. There is no hepatosplenomegaly. There is no tenderness. There is no  rebound, no guarding and no CVA tenderness.  Musculoskeletal: Normal range of motion. He exhibits no edema or tenderness.  Lymphadenopathy:    He has no cervical adenopathy.  Neurological: He is alert and oriented to person, place, and time. He has normal strength. No cranial nerve deficit or sensory deficit.  Skin: Skin is warm, dry and intact. No petechiae and no rash noted. He is not diaphoretic. No erythema. No pallor.  Psychiatric: He has a normal mood and affect. His behavior is normal. Judgment normal.  Nursing note and vitals reviewed.   ED Course  Procedures (including critical care time) DIAGNOSTIC STUDIES: Oxygen Saturation is 100% on RA, normal by my interpretation.    COORDINATION OF CARE: 3:39 AM Discussed treatment plan which includes pain management with pt at bedside and pt agreed to plan.  Labs Review Labs Reviewed - No data to display  Imaging Review No results found.   EKG Interpretation None      MDM   Final diagnoses:  None    Patient presents to the ED for dental pain.  He got oxycodone  in the ED for relief.  Will DC with 10 tabs of tramadol to take as needed.  He has dental follow up next week.  There is no oral swelling or concern for Ludwigs.  VS remain within his normal limits and he is safe for DC.   I personally performed the services described in this documentation, which was scribed in my presence. The recorded information has been reviewed and is accurate.      Tomasita Crumble, MD 04/02/16 [phone]

## 2016-04-03 ENCOUNTER — Encounter (HOSPITAL_COMMUNITY): Payer: Self-pay | Admitting: Emergency Medicine

## 2016-04-03 ENCOUNTER — Emergency Department (HOSPITAL_COMMUNITY)
Admission: EM | Admit: 2016-04-03 | Discharge: 2016-04-03 | Disposition: A | Discharge status: Home / Self Care | Payer: Self-pay | Attending: Emergency Medicine | Admitting: Emergency Medicine

## 2016-04-03 DIAGNOSIS — F17210 Nicotine dependence, cigarettes, uncomplicated: Secondary | ICD-10-CM | POA: Insufficient documentation

## 2016-04-03 DIAGNOSIS — Z79899 Other long term (current) drug therapy: Secondary | ICD-10-CM | POA: Insufficient documentation

## 2016-04-03 DIAGNOSIS — Z880 Allergy status to penicillin: Secondary | ICD-10-CM | POA: Insufficient documentation

## 2016-04-03 DIAGNOSIS — Z792 Long term (current) use of antibiotics: Secondary | ICD-10-CM | POA: Insufficient documentation

## 2016-04-03 DIAGNOSIS — Z8639 Personal history of other endocrine, nutritional and metabolic disease: Secondary | ICD-10-CM | POA: Insufficient documentation

## 2016-04-03 DIAGNOSIS — Z791 Long term (current) use of non-steroidal anti-inflammatories (NSAID): Secondary | ICD-10-CM | POA: Insufficient documentation

## 2016-04-03 DIAGNOSIS — K047 Periapical abscess without sinus: Secondary | ICD-10-CM | POA: Insufficient documentation

## 2016-04-03 MED ORDER — CLINDAMYCIN HCL 150 MG PO CAPS: 300.0000 mg | ORAL_CAPSULE | Freq: Four times a day (QID) | ORAL | Status: DC

## 2016-04-03 MED ORDER — OXYCODONE-ACETAMINOPHEN 5-325 MG PO TABS
1.0000 | ORAL_TABLET | Freq: Once | ORAL | Status: AC
  Administered 2016-04-03: 1 via ORAL
  Filled 2016-04-03: qty 1

## 2016-04-03 MED ORDER — OXYCODONE-ACETAMINOPHEN 5-325 MG PO TABS: 1.0000 | ORAL_TABLET | ORAL | Status: DC | PRN

## 2016-04-03 MED ORDER — NAPROXEN 500 MG PO TABS
500.0000 mg | ORAL_TABLET | Freq: Two times a day (BID) | ORAL | Status: DC
Start: 2016-04-03 — End: 2021-02-26

## 2016-04-03 MED ORDER — CLINDAMYCIN HCL 150 MG PO CAPS
300.0000 mg | ORAL_CAPSULE | Freq: Once | ORAL | Status: AC
  Administered 2016-04-03: 300 mg via ORAL
  Filled 2016-04-03: qty 2

## 2016-04-03 NOTE — ED Notes (Signed)
Pt was seen here yesterday for dental pain, has an appt next week with a dentist-- but woke up today with right side of face swollen--

## 2016-04-03 NOTE — ED Provider Notes (Signed)
CSN: [id number]     Arrival date & time 04/03/16  7829 History  By signing my name below, I, [name], attest that this documentation has been prepared under the direction and in the presence of Arnoldo Hooker, PA-C Electronically Signed: Charline Bills, ED Scribe 04/03/2016 at 9:42 AM.   Chief Complaint  Patient presents with  . Dental Pain   The history is provided by the patient. No language interpreter was used.   HPI Comments: Trevor Hopkins is a 30 y.o. male who presents to the Emergency Department complaining of gradually worsening right lower dental pain onset 2 days ago. Pt was seen in the ED yesterday for the same and discharged with Tramadol which he has not filled yet. He returns today with increased pain and right sided facial swelling onset this morning upon waking. No treatments tried PTA. He denies fever and trouble swallowing. Allergy to Penicillins.   Past Medical History  Diagnosis Date  . Thyroid disease    History reviewed. No pertinent past surgical history. No family history on file. Social History  Substance Use Topics  . Smoking status: Current Every Day Smoker -- 0.00 packs/day    Types: Cigarettes  . Smokeless tobacco: None  . Alcohol Use: Yes    Review of Systems  Constitutional: Negative for fever.  HENT: Positive for dental problem and facial swelling. Negative for trouble swallowing.    Allergies  Penicillins  Home Medications   Prior to Admission medications   Medication Sig Start Date End Date Taking? Authorizing Provider  HYDROcodone-acetaminophen (NORCO) 5-325 MG per tablet Take 1-2 tablets by mouth every 4 (four) hours as needed for moderate pain. 04/23/15   Charmayne Sheer Beers, PA-C  ibuprofen (ADVIL,MOTRIN) 800 MG tablet Take 1 tablet (800 mg total) by mouth every 8 (eight) hours as needed. 04/23/15   Charmayne Sheer Beers, PA-C  lidocaine (XYLOCAINE) 2 % solution Use as directed 20 mLs in the mouth or throat as needed for mouth pain. 04/23/15    Evangeline Dakin, PA-C  magic mouthwash w/lidocaine SOLN Take 5 mLs by mouth 3 (three) times daily. Swish and swallow. 01/28/16   Arthor Captain, PA-C  morphine (MSIR) 15 MG tablet Take 1 tablet (15 mg total) by mouth every 4 (four) hours as needed for severe pain. 01/28/16   Arthor Captain, PA-C  naproxen (NAPROSYN) 375 MG tablet Take 1 tablet (375 mg total) by mouth 2 (two) times daily. 01/28/16   Arthor Captain, PA-C  sulfamethoxazole-trimethoprim (BACTRIM DS,SEPTRA DS) 800-160 MG per tablet Take 1 tablet by mouth 2 (two) times daily. 04/23/15   Evangeline Dakin, PA-C  traMADol (ULTRAM) 50 MG tablet Take 1 tablet (50 mg total) by mouth every 12 (twelve) hours as needed. 04/02/16   Tomasita Crumble, MD   BP 142/82 mmHg  Pulse 105  Temp(Src) 98.6 F (37 C) (Oral)  Resp 16  SpO2 100% Physical Exam  Constitutional: He is oriented to person, place, and time. He appears well-developed and well-nourished. No distress.  HENT:  Head: Normocephalic and atraumatic.  Significant R facial swelling from the submental region to the cheek. Swelling is soft. No palpable cutaneous mass. No neck swelling. No tracheal deviation. Generally poor dentition with purulent drainage adjacent to #28. oropharynx benign. Pt tolerating secretions.   Eyes: Conjunctivae are normal. Pupils are equal, round, and reactive to light.  Neck: Neck supple. No tracheal deviation present.  Cardiovascular: Normal rate.   Pulmonary/Chest: Effort normal.  Musculoskeletal: Normal range of motion.  Neurological: He is alert and oriented to person, place, and time.  Skin: Skin is warm and dry.  Psychiatric: He has a normal mood and affect. His behavior is normal.  Nursing note and vitals reviewed.  ED Course  Procedures (including critical care time) DIAGNOSTIC STUDIES: Oxygen Saturation is 100% on RA, normal by my interpretation.    COORDINATION OF CARE: 9:29 AM-Discussed treatment plan which includes Percocet, Cleocin and Naproxen with  pt at bedside and pt agreed to plan.   Labs Review Labs Reviewed - No data to display  Imaging Review No results found.   EKG Interpretation None      MDM   Final diagnoses:  None    1. Dental abscess  The patient presents for evaluation of significant, quick onset facial swelling from the time he was seen yesterday until today. No fever. No difficulty swallowing. He is mildly tender to right anterior neck and submental space. Started on Clindamycin, Percocet for pain. Stressed return tomorrow for recheck to insure infection does not extend to deep structures or develop Ludwig's.   I personally performed the services described in this documentation, which was scribed in my presence. The recorded information has been reviewed and is accurate.     Elpidio AnisShari Regginald Pask, PA-C 04/03/16 [id number]  Benjiman CoreNathan Pickering, MD 04/03/16 [phone]

## 2016-04-03 NOTE — Discharge Instructions (Signed)

## 2019-06-27 ENCOUNTER — Other Ambulatory Visit: Payer: Self-pay

## 2019-06-27 DIAGNOSIS — Z20822 Contact with and (suspected) exposure to covid-19: Secondary | ICD-10-CM

## 2019-06-28 LAB — SPECIMEN STATUS REPORT

## 2019-06-28 LAB — NOVEL CORONAVIRUS, NAA: SARS-CoV-2, NAA: NOT DETECTED

## 2019-08-29 DIAGNOSIS — Z0000 Encounter for general adult medical examination without abnormal findings: Secondary | ICD-10-CM | POA: Diagnosis not present

## 2019-08-29 DIAGNOSIS — F1020 Alcohol dependence, uncomplicated: Secondary | ICD-10-CM | POA: Diagnosis not present

## 2019-08-29 DIAGNOSIS — Z1329 Encounter for screening for other suspected endocrine disorder: Secondary | ICD-10-CM | POA: Diagnosis not present

## 2019-08-29 DIAGNOSIS — R7989 Other specified abnormal findings of blood chemistry: Secondary | ICD-10-CM | POA: Diagnosis not present

## 2019-08-29 DIAGNOSIS — E669 Obesity, unspecified: Secondary | ICD-10-CM | POA: Diagnosis not present

## 2019-08-29 DIAGNOSIS — Z13228 Encounter for screening for other metabolic disorders: Secondary | ICD-10-CM | POA: Diagnosis not present

## 2019-08-29 DIAGNOSIS — R030 Elevated blood-pressure reading, without diagnosis of hypertension: Secondary | ICD-10-CM | POA: Diagnosis not present

## 2019-08-29 DIAGNOSIS — F17200 Nicotine dependence, unspecified, uncomplicated: Secondary | ICD-10-CM | POA: Diagnosis not present

## 2019-08-29 DIAGNOSIS — Z13220 Encounter for screening for lipoid disorders: Secondary | ICD-10-CM | POA: Diagnosis not present

## 2019-08-29 DIAGNOSIS — Z130 Encounter for screening for diseases of the blood and blood-forming organs and certain disorders involving the immune mechanism: Secondary | ICD-10-CM | POA: Diagnosis not present

## 2019-09-11 DIAGNOSIS — K760 Fatty (change of) liver, not elsewhere classified: Secondary | ICD-10-CM | POA: Diagnosis not present

## 2019-09-11 DIAGNOSIS — R160 Hepatomegaly, not elsewhere classified: Secondary | ICD-10-CM | POA: Diagnosis not present

## 2020-02-15 ENCOUNTER — Ambulatory Visit: Payer: Self-pay | Attending: Family

## 2020-02-15 DIAGNOSIS — Z23 Encounter for immunization: Secondary | ICD-10-CM

## 2020-02-15 NOTE — Progress Notes (Signed)
   Covid-19 Vaccination Clinic  Name:  Trevor Hopkins    MRN: [id number] DOB: 1986/02/04  02/15/2020  Trevor Hopkins was observed post Covid-19 immunization for 15 minutes without incident. He was provided with Vaccine Information Sheet and instruction to access the V-Safe system.   Trevor Hopkins was instructed to call 911 with any severe reactions post vaccine: Marland Kitchen Difficulty breathing  . Swelling of face and throat  . A fast heartbeat  . A bad rash all over body  . Dizziness and weakness   Immunizations Administered    Name Date Dose VIS Date Route   Moderna COVID-19 Vaccine 02/15/2020 11:34 AM 0.5 mL 10/10/2019 Intramuscular   Manufacturer: Moderna   Lot: [id number]   NDC: 13244-010-27

## 2020-03-19 ENCOUNTER — Ambulatory Visit: Payer: Self-pay | Attending: Family

## 2020-03-19 DIAGNOSIS — Z23 Encounter for immunization: Secondary | ICD-10-CM

## 2020-03-19 NOTE — Progress Notes (Signed)
   Covid-19 Vaccination Clinic  Name:  Trevor Hopkins    MRN: [id number] DOB: Jan 22, 1986  03/19/2020  [name] was observed post Covid-19 immunization for 15 minutes without incident. He was provided with Vaccine Information Sheet and instruction to access the V-Safe system.   [name] was instructed to call 911 with any severe reactions post vaccine: Marland Kitchen Difficulty breathing  . Swelling of face and throat  . A fast heartbeat  . A bad rash all over body  . Dizziness and weakness   Immunizations Administered    Name Date Dose VIS Date Route   Moderna COVID-19 Vaccine 03/19/2020 10:55 AM 0.5 mL 10/2019 Intramuscular   Manufacturer: Moderna   Lot: [id number]   NDC: 60888-358-44

## 2021-02-26 ENCOUNTER — Ambulatory Visit (HOSPITAL_COMMUNITY)
Admission: EM | Admit: 2021-02-26 | Discharge: 2021-02-26 | Disposition: A | Discharge status: Home / Self Care | Payer: Self-pay | Attending: Family Medicine | Admitting: Family Medicine

## 2021-02-26 ENCOUNTER — Encounter (HOSPITAL_COMMUNITY): Payer: Self-pay

## 2021-02-26 ENCOUNTER — Other Ambulatory Visit: Payer: Self-pay

## 2021-02-26 DIAGNOSIS — R519 Headache, unspecified: Secondary | ICD-10-CM

## 2021-02-26 NOTE — ED Triage Notes (Signed)
Pt presents with headache x 2 days. Ibuprofen gives no relief, last dose yesterday. Denies any other symptoms.

## 2021-02-26 NOTE — ED Provider Notes (Signed)
[facility]   [id number] 02/26/21 Arrival Time: 1257  ASSESSMENT & PLAN:  1. Acute nonintractable headache, unspecified headache type    Declines IM Toradol. Comfortable with home observation.  Normal neurological exam. Without fever, focal neuro logical deficits, nuchal rigidity, or change in vision. No indication for neurodiagnostic workup at this time. Discussed.  Recommend:  Follow-up Information    [facility] EMERGENCY DEPARTMENT.   Specialty: Emergency Medicine Why: If symptoms worsen in any way. Contact information: [address] [id number] mc [place] [address] [phone]               Discharge Instructions     If not allergic, you may use over the counter ibuprofen or acetaminophen as needed.  Seek prompt medical care if:  You have: ? A very bad (severe) headache that is not helped by medicine. ? Trouble walking or weakness in your arms and legs. ? Clear or bloody fluid coming from your nose or ears. ? Changes in your seeing (vision). ? Jerky movements that you cannot control (seizure).  You throw up (vomit).  You lose balance.  Your speech is slurred.  You pass out.  You are sleepier and have trouble staying awake.  The black centers of your eyes (pupils) change in size.  These symptoms may be an emergency. Do not wait to see if the symptoms will go away. Get medical help right away. Call your local emergency services. Do not drive yourself to the hospital.      Reviewed expectations re: course of current medical issues. Questions answered. Outlined signs and symptoms indicating need for more acute intervention. Patient verbalized understanding. After Visit Summary given.   SUBJECTIVE: History from: Patient Patient is able to give a clear and coherent history.  Trevor Hopkins is a 35 y.o. male who presents with complaint of a L sided frontal headache. Gradual onset approx 2  d ago. Throbbing in nature without radiation. No head injury reported. Not worst headache of life. Ambulatory without difficulty. No extremity sensation changes or weakness. Nausea/vomiting: no. Vision changes: no. Increased sensitivity to light and to noises: no. Fever: no. Sinus pressure/congestion: no. Home treatment: none reported.  Social History   Tobacco Use  Smoking Status Current Every Day Smoker  . Packs/day: 0.00  . Types: Cigarettes  Smokeless Tobacco Never Used  Also smokes THC. Other drug use denied.  Current headache has not limited normal daily activities. Denies depression, dizziness, loss of balance and muscle weakness. No head injury reported.  OBJECTIVE:  Vitals:   02/26/21 1337  BP: 128/70  Pulse: 65  Resp: 19  Temp: 98.6 F (37 C)  TempSrc: Oral  SpO2: 97%    General appearance: alert; NADS HENT: normocephalic; atraumatic Eyes: PERRLA; EOMI; conjunctivae normal Neck: supple with FROM Lungs: clear unlabored Extremities: no edema; symmetrical with no gross deformities Skin: warm and dry Neurologic: alert; speech is fluent and clear without dysarthria or aphasia; CN 2-12 grossly intact; no facial droop; normal gait Psychological: alert and cooperative; normal mood and affect    Allergies  Allergen Reactions  . Penicillins Other (See Comments), Rash and Swelling    From childhood-all "cillins"    Past Medical History:  Diagnosis Date  . Thyroid disease    Social History   Socioeconomic History  . Marital status: Married    Spouse name: Not on file  . Number of children: Not on file  . Years of education: Not on file  .  Highest education level: Not on file  Occupational History  . Not on file  Tobacco Use  . Smoking status: Current Every Day Smoker    Packs/day: 0.00    Types: Cigarettes  . Smokeless tobacco: Never Used  Substance and Sexual Activity  . Alcohol use: Yes  . Drug use: Yes    Types: Marijuana  . Sexual activity:  Not on file  Other Topics Concern  . Not on file  Social History Narrative  . Not on file   Social Determinants of Health   Financial Resource Strain: Not on file  Food Insecurity: Not on file  Transportation Needs: Not on file  Physical Activity: Not on file  Stress: Not on file  Social Connections: Not on file  Intimate Partner Violence: Not on file   No family history on file. No past surgical history on file.   Mardella Layman, MD 02/26/21 [phone]

## 2021-02-26 NOTE — Discharge Instructions (Signed)
If not allergic, you may use over the counter ibuprofen or acetaminophen as needed.  Seek prompt medical care if: You have: A very bad (severe) headache that is not helped by medicine. Trouble walking or weakness in your arms and legs. Clear or bloody fluid coming from your nose or ears. Changes in your seeing (vision). Jerky movements that you cannot control (seizure). You throw up (vomit). You lose balance. Your speech is slurred. You pass out. You are sleepier and have trouble staying awake. The black centers of your eyes (pupils) change in size.  These symptoms may be an emergency. Do not wait to see if the symptoms will go away. Get medical help right away. Call your local emergency services. Do not drive yourself to the hospital.

## 2021-07-23 ENCOUNTER — Other Ambulatory Visit: Payer: Self-pay

## 2021-07-23 ENCOUNTER — Encounter (HOSPITAL_COMMUNITY): Payer: Self-pay

## 2021-07-23 ENCOUNTER — Ambulatory Visit (HOSPITAL_COMMUNITY)
Admission: EM | Admit: 2021-07-23 | Discharge: 2021-07-23 | Disposition: A | Discharge status: Home / Self Care | Payer: Self-pay | Attending: Emergency Medicine | Admitting: Emergency Medicine

## 2021-07-23 DIAGNOSIS — I10 Essential (primary) hypertension: Secondary | ICD-10-CM

## 2021-07-23 DIAGNOSIS — R519 Headache, unspecified: Secondary | ICD-10-CM

## 2021-07-23 MED ORDER — KETOROLAC TROMETHAMINE 60 MG/2ML IM SOLN
INTRAMUSCULAR | Status: AC
  Filled 2021-07-23: qty 2

## 2021-07-23 MED ORDER — KETOROLAC TROMETHAMINE 60 MG/2ML IM SOLN
60.0000 mg | Freq: Once | INTRAMUSCULAR | Status: AC
  Administered 2021-07-23: 60 mg via INTRAMUSCULAR

## 2021-07-23 MED ORDER — AMLODIPINE BESYLATE 2.5 MG PO TABS: 2.5000 mg | ORAL_TABLET | Freq: Every day | ORAL | 2 refills | Status: DC

## 2021-07-23 NOTE — Discharge Instructions (Addendum)
You received an injection of ketorolac today to resolve your headache.  I have sent a prescription for a blood pressure medicine called amlodipine 2.5 mg to your pharmacy.  Please take 1 tablet every morning.  Please make an appointment to follow-up with your primary care physician in the next 1 to 2 weeks to discuss your blood pressure, continuing this medication or changing to an alternate therapy.  Please keep in mind that if you experience worsening headache after this injection, or ever experienced the worst headache of your life, you should call 911 for emergent attention, do not attempt to drive yourself to the emergency room as this could be a sign of stroke.  Thank you for entrusting me with your care today.  I hope you feel better soon.

## 2021-07-23 NOTE — ED Provider Notes (Addendum)
[facility]    CSN: [id number] Arrival date & time: 07/23/21  1456      History   Chief Complaint Chief Complaint  Patient presents with  . Headache    HPI Trevor Hopkins is a 35 y.o. male.   Patient complains of a 2-day history of headache, rates his pain 10 out of 10 at its most severe which was this morning before he vomited.  Patient currently rates his pain 7 out of 10.  Patient states pain is just behind his eyes on both sides, denies photophobia and vision changes.  Patient states he has had 1 episode of headache like this in the past, states his primary care physician advised him that this was most likely due to stress.  Patient's EMR reviewed, patient's blood pressure today is elevated as it has been intermittently for the past 5 years.  Patient states he has never been diagnosed with high blood pressure nor has he ever been prescribed medication for this.  Patient states his current headache began yesterday at work something very stressful happened, states onset was gradual, lasted all night and as aforementioned became more intense this morning.  The history is provided by the patient.  Headache  Past Medical History:  Diagnosis Date  . Thyroid disease     There are no problems to display for this patient.   History reviewed. No pertinent surgical history.     Home Medications    Prior to Admission medications   Medication Sig Start Date End Date Taking? Authorizing Provider  amLODipine (NORVASC) 2.5 MG tablet Take 1 tablet (2.5 mg total) by mouth daily. 07/23/21 10/21/21 Yes Theadora Rama Scales, PA-C  ibuprofen (ADVIL,MOTRIN) 800 MG tablet Take 1 tablet (800 mg total) by mouth every 8 (eight) hours as needed. 04/23/15   Beers, Charmayne Sheer, PA-C  morphine (MSIR) 15 MG tablet Take 1 tablet (15 mg total) by mouth every 4 (four) hours as needed for severe pain. 01/28/16   Arthor Captain, PA-C    Family History Family History  Family history  unknown: Yes    Social History Social History   Tobacco Use  . Smoking status: Every Day    Packs/day: 0.00    Types: Cigarettes  . Smokeless tobacco: Never  Substance Use Topics  . Alcohol use: Yes  . Drug use: Yes    Types: Marijuana     Allergies   Penicillins   Review of Systems Review of Systems Per HPI  Physical Exam Triage Vital Signs ED Triage Vitals  Enc Vitals Group     BP 07/23/21 1626 (!) 141/83     Pulse Rate 07/23/21 1626 60     Resp 07/23/21 1626 18     Temp 07/23/21 1626 98 F (36.7 C)     Temp Source 07/23/21 1626 Oral     SpO2 07/23/21 1626 100 %     Weight --      Height --      Head Circumference --      Peak Flow --      Pain Score 07/23/21 1627 6     Pain Loc --      Pain Edu? --      Excl. in GC? --    No data found.  Updated Vital Signs BP (!) 141/83 (BP Location: Left Arm)   Pulse 60   Temp 98 F (36.7 C) (Oral)   Resp 18   SpO2 100%   Visual Acuity Right  Eye Distance:   Left Eye Distance:   Bilateral Distance:    Right Eye Near:   Left Eye Near:    Bilateral Near:     Physical Exam Constitutional:      General: He is not in acute distress.    Appearance: He is well-developed and normal weight. He is not ill-appearing, toxic-appearing or diaphoretic.  HENT:     Head: Normocephalic and atraumatic.  Eyes:     General: No visual field deficit.    Extraocular Movements: Extraocular movements intact.     Pupils: Pupils are equal, round, and reactive to light.  Cardiovascular:     Rate and Rhythm: Normal rate and regular rhythm.     Heart sounds: Normal heart sounds.  Pulmonary:     Effort: Pulmonary effort is normal.     Breath sounds: Normal breath sounds.  Musculoskeletal:        General: Normal range of motion.     Cervical back: Normal range of motion and neck supple.  Skin:    General: Skin is warm and dry.  Neurological:     Mental Status: He is alert and oriented to person, place, and time. Mental status  is at baseline.     Cranial Nerves: No cranial nerve deficit, dysarthria or facial asymmetry.     Sensory: No sensory deficit.     Motor: No weakness.     Coordination: Romberg sign negative. Coordination normal.     Gait: Gait normal.     Deep Tendon Reflexes: Reflexes normal.  Psychiatric:        Mood and Affect: Mood normal.        Speech: Speech normal.        Behavior: Behavior normal.     UC Treatments / Results  Labs (all labs ordered are listed, but only abnormal results are displayed) Labs Reviewed - No data to display  EKG   Radiology No results found.  Procedures Procedures (including critical care time)  Medications Ordered in UC Medications  ketorolac (TORADOL) injection 60 mg (has no administration in time range)    Initial Impression / Assessment and Plan / UC Course  I have reviewed the triage vital signs and the nursing notes.  Pertinent labs & imaging results that were available during my care of the patient were reviewed by me and considered in my medical decision making (see chart for details).     Patient is in no acute distress at this time.  Patient and I discussed the mechanics behind high blood pressure, increased stress and mechanism of headache.  We also discussed his increased risk of stroke, heart attack and kidney failure in the setting of uncontrolled high blood pressure. I recommend that patient begin a low-dose of amlodipine at this time to address his mildly elevated blood pressure on a daily basis.  Patient will also be provided with a ketorolac injection to abort his current headache.  Patient has been instructed that should he experience worsening headache or worst headache of his life that he should contact 911 and not attempt to drive himself to the emergency room.  Verbalized understanding and agreement with plan as outlined.  Patient is also been advised to follow-up with his primary care provider in a few weeks to recheck his blood  pressure and discuss continuing amlodipine or changing to an alternate therapy. Final Clinical Impressions(s) / UC Diagnoses   Final diagnoses:  Essential hypertension  Bad headache     Discharge Instructions  You received an injection of ketorolac today to resolve your headache.  I have sent a prescription for a blood pressure medicine called amlodipine 2.5 mg to your pharmacy.  Please take 1 tablet every morning.  Please make an appointment to follow-up with your primary care physician in the next 1 to 2 weeks to discuss your blood pressure, continuing this medication or changing to an alternate therapy.  Please keep in mind that if you experience worsening headache after this injection, or ever experienced the worst headache of your life, you should call 911 for emergent attention, do not attempt to drive yourself to the emergency room as this could be a sign of stroke.  Thank you for entrusting me with your care today.  I hope you feel better soon.     ED Prescriptions     Medication Sig Dispense Auth. Provider   amLODipine (NORVASC) 2.5 MG tablet Take 1 tablet (2.5 mg total) by mouth daily. 30 tablet Theadora Rama Scales, PA-C      PDMP not reviewed this encounter.   Theadora Rama Scales, PA-C 07/23/21 1650    Theadora Rama [place], [place] 07/23/21 1651

## 2021-07-23 NOTE — ED Triage Notes (Signed)
Pt presents with headache X 2 days with some mild nausea.

## 2021-08-04 ENCOUNTER — Emergency Department (HOSPITAL_COMMUNITY): Payer: Self-pay

## 2021-08-04 ENCOUNTER — Emergency Department (HOSPITAL_COMMUNITY)
Admission: EM | Admit: 2021-08-04 | Discharge: 2021-08-04 | Disposition: A | Discharge status: Home / Self Care | Payer: Self-pay | Attending: Emergency Medicine | Admitting: Emergency Medicine

## 2021-08-04 ENCOUNTER — Other Ambulatory Visit: Payer: Self-pay

## 2021-08-04 DIAGNOSIS — Y9289 Other specified places as the place of occurrence of the external cause: Secondary | ICD-10-CM | POA: Insufficient documentation

## 2021-08-04 DIAGNOSIS — M25561 Pain in right knee: Secondary | ICD-10-CM | POA: Insufficient documentation

## 2021-08-04 DIAGNOSIS — F17210 Nicotine dependence, cigarettes, uncomplicated: Secondary | ICD-10-CM | POA: Insufficient documentation

## 2021-08-04 DIAGNOSIS — Y9389 Activity, other specified: Secondary | ICD-10-CM | POA: Insufficient documentation

## 2021-08-04 DIAGNOSIS — W01198A Fall on same level from slipping, tripping and stumbling with subsequent striking against other object, initial encounter: Secondary | ICD-10-CM | POA: Insufficient documentation

## 2021-08-04 NOTE — Discharge Instructions (Signed)
The x-ray of your knee is negative for any fractures.  This does not rule out a tendon or ligament injury.  I recommend Ace wrap, cold compresses, elevation of the knee, Tylenol and ibuprofen as discussed below.  Please follow-up with orthopedics if you continue to have symptoms  Please use Tylenol or ibuprofen for pain.  You may use 600 mg ibuprofen every 6 hours or 1000 mg of Tylenol every 6 hours.  You may choose to alternate between the 2.  This would be most effective.  Not to exceed 4 g of Tylenol within 24 hours.  Not to exceed 3200 mg ibuprofen 24 hours.   I do not think that is unreasonable that you hold off on crutches but if you are having popping and instability of any would recommend holding off on any walking and returning to the ER so we can fit you with a knee immobilizer.

## 2021-08-04 NOTE — ED Provider Notes (Signed)
[facility] EMERGENCY DEPARTMENT Provider Note   CSN: [id number] Arrival date & time: 08/04/21  1217     History Chief Complaint  Patient presents with   Knee Pain    Trevor Hopkins is a 35 y.o. male.  HPI Patient is a 35 year old male presented to the ER today with right knee pain.  He states that last night he was playing with his friends and turned suddenly twisted his knee strangely and fell to the ground smacking his patella against the ground.  He states he does not have any abrasions lacerations and denies any bleeding from any other areas.  Denies any head injury arm or other lower EXTR injury  Denies any chest pain or shortness of breath lightheadedness or dizziness states that the pain in his right knee is 5/10 achy constant pain has taken no medications prior to arrival in the ER.  Worse with touch and movement denies any popping or instability with walking.     Past Medical History:  Diagnosis Date   Thyroid disease     There are no problems to display for this patient.   No past surgical history on file.     Family History  Family history unknown: Yes    Social History   Tobacco Use   Smoking status: Every Day    Packs/day: 0.00    Types: Cigarettes   Smokeless tobacco: Never  Substance Use Topics   Alcohol use: Yes   Drug use: Yes    Types: Marijuana    Home Medications Prior to Admission medications   Medication Sig Start Date End Date Taking? Authorizing Provider  amLODipine (NORVASC) 2.5 MG tablet Take 1 tablet (2.5 mg total) by mouth daily. 07/23/21 10/21/21  Theadora Rama Scales, PA-C  ibuprofen (ADVIL,MOTRIN) 800 MG tablet Take 1 tablet (800 mg total) by mouth every 8 (eight) hours as needed. 04/23/15   Beers, Charmayne Sheer, PA-C  morphine (MSIR) 15 MG tablet Take 1 tablet (15 mg total) by mouth every 4 (four) hours as needed for severe pain. 01/28/16   Arthor Captain, PA-C    Allergies    Penicillins  Review of  Systems   Review of Systems  Constitutional:  Negative for fever.  HENT:  Negative for congestion.   Respiratory:  Negative for shortness of breath.   Cardiovascular:  Negative for chest pain.  Gastrointestinal:  Negative for abdominal distention.  Musculoskeletal:        Right knee pain  Neurological:  Negative for dizziness and headaches.   Physical Exam Updated Vital Signs BP (!) 150/85   Pulse 88   Temp 98.6 F (37 C)   Resp 16   SpO2 96%   Physical Exam Vitals and nursing note reviewed.  Constitutional:      General: He is not in acute distress. HENT:     Head: Normocephalic and atraumatic.     Nose: Nose normal.  Eyes:     General: No scleral icterus. Cardiovascular:     Rate and Rhythm: Normal rate and regular rhythm.     Pulses: Normal pulses.     Heart sounds: Normal heart sounds.  Pulmonary:     Effort: Pulmonary effort is normal. No respiratory distress.     Breath sounds: No wheezing.  Abdominal:     Palpations: Abdomen is soft.     Tenderness: There is no abdominal tenderness.  Musculoskeletal:     Cervical back: Normal range of motion.  Right lower leg: No edema.     Left lower leg: No edema.     Comments: Sensation intact in bilateral lower extremities.  Able to flex and extend right knee through full range of motion no popping.  No obvious trauma to the knee.  No abrasions or lacerations.  Negative anterior posterior drawer test  Skin:    General: Skin is warm and dry.     Capillary Refill: Capillary refill takes less than 2 seconds.  Neurological:     Mental Status: He is alert. Mental status is at baseline.  Psychiatric:        Mood and Affect: Mood normal.        Behavior: Behavior normal.    ED Results / Procedures / Treatments   Labs (all labs ordered are listed, but only abnormal results are displayed) Labs Reviewed - No data to display  EKG None  Radiology DG Knee Complete 4 Views Right  Result Date: 08/04/2021 CLINICAL  DATA:  Right knee pain with history of injury EXAM: RIGHT KNEE - COMPLETE 4 VIEW COMPARISON:  None. FINDINGS: No acute fracture or dislocation. Trace joint effusion. No evidence of arthropathy or other focal bone abnormality. Soft tissues are unremarkable. IMPRESSION: No acute osseous abnormality. Electronically Signed   By: Allegra Lai M.D.   On: 08/04/2021 13:18    Procedures Procedures   Medications Ordered in ED Medications - No data to display  ED Course  I have reviewed the triage vital signs and the nursing notes.  Pertinent labs & imaging results that were available during my care of the patient were reviewed by me and considered in my medical decision making (see chart for details).    MDM Rules/Calculators/A&P                           Patient is a 35 year old male presented today after knee injury yesterday evening seems that he twisted his knee and fell to the ground smacking his anterior knee against the ground.  Did not injure any other part of his body during this fall.  No head trauma or loss of consciousness nausea or vomiting  On my examination patient has negative anterior posterior drawer does not seem to have any instability in his knee he has been walking without crutches or any orthopedic device.  I personally reviewed x-ray right knee 4 view there is no fracture  Overall well-appearing.  Offered knee immobilizer and crutches which he declined.  Recommend follow-up with orthopedics.  Patient discharged from waiting room.  Final Clinical Impression(s) / ED Diagnoses Final diagnoses:  Acute pain of right knee    Rx / DC Orders ED Discharge Orders     None        Gailen Shelter, [place] 08/04/21 1458    Milagros Loll, MD 08/05/21 1413

## 2021-08-04 NOTE — ED Triage Notes (Signed)
Pt reports R knee pain since last night when he injured it playing around with friends.

## 2021-10-04 ENCOUNTER — Other Ambulatory Visit: Payer: Self-pay

## 2021-10-04 ENCOUNTER — Emergency Department (HOSPITAL_BASED_OUTPATIENT_CLINIC_OR_DEPARTMENT_OTHER)
Admission: EM | Admit: 2021-10-04 | Discharge: 2021-10-04 | Disposition: A | Discharge status: Home / Self Care | Payer: Self-pay | Attending: Emergency Medicine | Admitting: Emergency Medicine

## 2021-10-04 ENCOUNTER — Emergency Department (HOSPITAL_BASED_OUTPATIENT_CLINIC_OR_DEPARTMENT_OTHER): Payer: Self-pay

## 2021-10-04 ENCOUNTER — Encounter (HOSPITAL_BASED_OUTPATIENT_CLINIC_OR_DEPARTMENT_OTHER): Payer: Self-pay | Admitting: Emergency Medicine

## 2021-10-04 DIAGNOSIS — R1032 Left lower quadrant pain: Secondary | ICD-10-CM | POA: Insufficient documentation

## 2021-10-04 DIAGNOSIS — R109 Unspecified abdominal pain: Secondary | ICD-10-CM

## 2021-10-04 DIAGNOSIS — K5900 Constipation, unspecified: Secondary | ICD-10-CM | POA: Insufficient documentation

## 2021-10-04 DIAGNOSIS — F17210 Nicotine dependence, cigarettes, uncomplicated: Secondary | ICD-10-CM | POA: Insufficient documentation

## 2021-10-04 LAB — CBC WITH DIFFERENTIAL/PLATELET
Abs Immature Granulocytes: 0.01 10*3/uL (ref 0.00–0.07)
Basophils Absolute: 0.1 10*3/uL (ref 0.0–0.1)
Basophils Relative: 1 %
Eosinophils Absolute: 0.1 10*3/uL (ref 0.0–0.5)
Eosinophils Relative: 2 %
HCT: 46.6 % (ref 39.0–52.0)
Hemoglobin: 16.4 g/dL (ref 13.0–17.0)
Immature Granulocytes: 0 %
Lymphocytes Relative: 35 %
Lymphs Abs: 2.7 10*3/uL (ref 0.7–4.0)
MCH: 34 pg (ref 26.0–34.0)
MCHC: 35.2 g/dL (ref 30.0–36.0)
MCV: 96.7 fL (ref 80.0–100.0)
Monocytes Absolute: 0.5 10*3/uL (ref 0.1–1.0)
Monocytes Relative: 7 %
Neutro Abs: 4.3 10*3/uL (ref 1.7–7.7)
Neutrophils Relative %: 55 %
Platelets: 279 10*3/uL (ref 150–400)
RBC: 4.82 MIL/uL (ref 4.22–5.81)
RDW: 11.9 % (ref 11.5–15.5)
WBC: 7.8 10*3/uL (ref 4.0–10.5)
nRBC: 0 % (ref 0.0–0.2)

## 2021-10-04 LAB — COMPREHENSIVE METABOLIC PANEL
ALT: 22 U/L (ref 0–44)
AST: 28 U/L (ref 15–41)
Albumin: 4.5 g/dL (ref 3.5–5.0)
Alkaline Phosphatase: 62 U/L (ref 38–126)
Anion gap: 11 (ref 5–15)
BUN: 11 mg/dL (ref 6–20)
CO2: 24 mmol/L (ref 22–32)
Calcium: 9.4 mg/dL (ref 8.9–10.3)
Chloride: 105 mmol/L (ref 98–111)
Creatinine, Ser: 0.73 mg/dL (ref 0.61–1.24)
GFR, Estimated: >60 mL/min (ref >60)
Glucose, Bld: 120 mg/dL — ABNORMAL HIGH (ref 70–99)
Potassium: 3.7 mmol/L (ref 3.5–5.1)
Sodium: 140 mmol/L (ref 135–145)
Total Bilirubin: 0.6 mg/dL (ref 0.3–1.2)
Total Protein: 7.6 g/dL (ref 6.5–8.1)

## 2021-10-04 LAB — URINALYSIS, ROUTINE W REFLEX MICROSCOPIC
Bilirubin Urine: NEGATIVE
Glucose, UA: NEGATIVE mg/dL
Hgb urine dipstick: NEGATIVE
Ketones, ur: NEGATIVE mg/dL
Leukocytes,Ua: NEGATIVE
Nitrite: NEGATIVE
Specific Gravity, Urine: 1.023 (ref 1.005–1.030)
pH: 5.5 (ref 5.0–8.0)

## 2021-10-04 LAB — LIPASE, BLOOD: Lipase: 59 U/L — ABNORMAL HIGH (ref 11–51)

## 2021-10-04 MED ORDER — KETOROLAC TROMETHAMINE 15 MG/ML IJ SOLN
15.0000 mg | Freq: Once | INTRAMUSCULAR | Status: AC
  Administered 2021-10-04: 15 mg via INTRAVENOUS
  Filled 2021-10-04: qty 1

## 2021-10-04 MED ORDER — NAPROXEN 500 MG PO TABS: 500.0000 mg | ORAL_TABLET | Freq: Two times a day (BID) | ORAL | 0 refills | Status: AC

## 2021-10-04 MED ORDER — SODIUM CHLORIDE 0.9 % IV BOLUS
1000.0000 mL | Freq: Once | INTRAVENOUS | Status: AC
  Administered 2021-10-04: 1000 mL via INTRAVENOUS

## 2021-10-04 MED ORDER — METHOCARBAMOL 500 MG PO TABS: 500.0000 mg | ORAL_TABLET | Freq: Two times a day (BID) | ORAL | 0 refills | Status: DC

## 2021-10-04 NOTE — ED Provider Notes (Signed)
[facility] EMERGENCY DEPT Provider Note   CSN: [id number] Arrival date & time: 10/04/21  1647     History Chief Complaint  Patient presents with   Flank Pain    Trevor Hopkins is a 35 y.o. male.  HPI   Pt is a 35 y/o male with a h/o thyroid disease who presents to the ED today for eval of left flank pain. Pain radiates to the left abdomen. Pain has been present for the last 3-4 weeks. The pain is constant. He notes that the pain is worse with movement, certain positions and when lifting. He does do a lot of heavy lifting for work. He started having some vomiting earlier this week that has since improved. He has had some constipation. Denies fevers or urinary sxs.   Pt denies any numbness/tingling to the BLE. Denies saddle anesthesia. Denies loss of control of bowels or bladder. No urinary retention. No fevers. Denies a h/o IVDU. Denies a h/o CA.  Past Medical History:  Diagnosis Date   Thyroid disease     There are no problems to display for this patient.   History reviewed. No pertinent surgical history.     Family History  Family history unknown: Yes    Social History   Tobacco Use   Smoking status: Every Day    Packs/day: 0.00    Types: Cigarettes   Smokeless tobacco: Never  Substance Use Topics   Alcohol use: Yes   Drug use: Yes    Types: Marijuana    Home Medications Prior to Admission medications   Medication Sig Start Date End Date Taking? Authorizing Provider  methocarbamol (ROBAXIN) 500 MG tablet Take 1 tablet (500 mg total) by mouth 2 (two) times daily. 10/04/21  Yes Jhovanny Guinta S, PA-C  naproxen (NAPROSYN) 500 MG tablet Take 1 tablet (500 mg total) by mouth 2 (two) times daily for 7 days. 10/04/21 10/11/21 Yes Makynzie Dobesh S, PA-C  amLODipine (NORVASC) 2.5 MG tablet Take 1 tablet (2.5 mg total) by mouth daily. 07/23/21 10/21/21  Lynden Oxford Scales, PA-C  ibuprofen (ADVIL,MOTRIN) 800 MG tablet Take 1 tablet (800 mg  total) by mouth every 8 (eight) hours as needed. 04/23/15   Beers, Pierce Crane, PA-C  morphine (MSIR) 15 MG tablet Take 1 tablet (15 mg total) by mouth every 4 (four) hours as needed for severe pain. 01/28/16   Margarita Mail, PA-C    Allergies    Penicillins  Review of Systems   Review of Systems  Constitutional:  Negative for chills and fever.  HENT:  Negative for ear pain and sore throat.   Eyes:  Negative for visual disturbance.  Respiratory:  Negative for cough and shortness of breath.   Cardiovascular:  Negative for chest pain.  Gastrointestinal:  Positive for constipation, nausea and vomiting. Negative for abdominal pain and diarrhea.  Genitourinary:  Negative for dysuria and hematuria.  Musculoskeletal:  Positive for back pain.  Skin:  Negative for rash.  Neurological:  Negative for weakness and numbness.  All other systems reviewed and are negative.  Physical Exam Updated Vital Signs BP (!) 151/82   Pulse 72   Temp 98.3 F (36.8 C) (Oral)   Resp 18   Ht 6\' 6"  (1.981 m)   Wt 113.4 kg   SpO2 100%   BMI 28.89 kg/m   Physical Exam Vitals and nursing note reviewed.  Constitutional:      General: He is not in acute distress.    Appearance: He is  well-developed.  HENT:     Head: Normocephalic and atraumatic.  Eyes:     Conjunctiva/sclera: Conjunctivae normal.  Cardiovascular:     Rate and Rhythm: Normal rate and regular rhythm.     Heart sounds: No murmur heard. Pulmonary:     Effort: Pulmonary effort is normal. No respiratory distress.     Breath sounds: Normal breath sounds. No wheezing, rhonchi or rales.  Abdominal:     General: Bowel sounds are normal.     Palpations: Abdomen is soft.     Tenderness: There is abdominal tenderness (LLQ). There is left CVA tenderness. There is no right CVA tenderness, guarding or rebound.  Musculoskeletal:        General: No swelling.     Cervical back: Neck supple.     Comments: No midline lumbar ttp. Ttp noted to the left  lumbar and lower thoracic paraspinous muscles  Skin:    General: Skin is warm and dry.     Capillary Refill: Capillary refill takes less than 2 seconds.  Neurological:     Mental Status: He is alert.  Psychiatric:        Mood and Affect: Mood normal.    ED Results / Procedures / Treatments   Labs (all labs ordered are listed, but only abnormal results are displayed) Labs Reviewed  COMPREHENSIVE METABOLIC PANEL - Abnormal; Notable for the following components:      Result Value   Glucose, Bld 120 (*)    All other components within normal limits  LIPASE, BLOOD - Abnormal; Notable for the following components:   Lipase 59 (*)    All other components within normal limits  URINALYSIS, ROUTINE W REFLEX MICROSCOPIC - Abnormal; Notable for the following components:   Protein, ur TRACE (*)    All other components within normal limits  CBC WITH DIFFERENTIAL/PLATELET    EKG None  Radiology CT Renal Stone Study  Result Date: 10/04/2021 CLINICAL DATA:  Flank pain.  Kidney stone suspected. EXAM: CT ABDOMEN AND PELVIS WITHOUT CONTRAST TECHNIQUE: Multidetector CT imaging of the abdomen and pelvis was performed following the standard protocol without IV contrast. COMPARISON:  None. FINDINGS: Lower chest: No acute abnormality. Hepatobiliary: No focal liver abnormality. No gallstones, gallbladder wall thickening, or pericholecystic fluid. No biliary dilatation. Pancreas: No focal lesion. Normal pancreatic contour. No surrounding inflammatory changes. No main pancreatic ductal dilatation. Spleen: Normal in size without focal abnormality. Adrenals/Urinary Tract: No adrenal nodule bilaterally. No nephrolithiasis and no hydronephrosis. No definite contour-deforming renal mass. No ureterolithiasis or hydroureter. The urinary bladder is unremarkable. Stomach/Bowel: Stomach is within normal limits. No evidence of bowel wall thickening or dilatation. Appendix appears normal. Vascular/Lymphatic: No abdominal  aorta or iliac aneurysm. Reproductive: Prostate is unremarkable. Other: No intraperitoneal free fluid. No intraperitoneal free gas. No organized fluid collection. Musculoskeletal: No abdominal wall hernia or abnormality. No suspicious lytic or blastic osseous lesions. No acute displaced fracture. Multilevel degenerative changes of the spine. IMPRESSION: No acute intra-abdominal or intrapelvic abnormality with limited evaluation on this noncontrast study. Electronically Signed   By: Tish Frederickson M.D.   On: 10/04/2021 18:45    Procedures Procedures   Medications Ordered in ED Medications  sodium chloride 0.9 % bolus 1,000 mL (1,000 mLs Intravenous New Bag/Given 10/04/21 1819)  ketorolac (TORADOL) 15 MG/ML injection 15 mg (15 mg Intravenous Given 10/04/21 1819)    ED Course  I have reviewed the triage vital signs and the nursing notes.  Pertinent labs & imaging results that were available  during my care of the patient were reviewed by me and considered in my medical decision making (see chart for details).    MDM Rules/Calculators/A&P                          35 y/o male presents for eval of left flank and abd pain. Nv earlier this week that has improved   Reviewed/interpreted labs CBC unremarkable CMP unremarkable Lipase marginally elevated, doubt of clinical significance UA neg for UTI  Reviewed/interpreted imaging  CT renal -  No acute intra-abdominal or intrapelvic abnormality with limited evaluation on this noncontrast study.  Pt was given ivf and a dose of toradol in the ED. He felt improvement of sxs on reassessment. His w/u here is reassuring. I suspect he may have a muscle strain from his heavy lifting. Recommended conservative tx with antiinflammatories and muscle relaxers. Advised him to f/u with pcp and return if worse. He voices understanding of plan and reasons to return. All questions answered, pt stable for discharge   Final Clinical Impression(s) / ED  Diagnoses Final diagnoses:  Flank pain    Rx / DC Orders ED Discharge Orders          Ordered    naproxen (NAPROSYN) 500 MG tablet  2 times daily        10/04/21 1936    methocarbamol (ROBAXIN) 500 MG tablet  2 times daily        10/04/21 1936             Bishop Dublin 10/04/21 1936    Hayden Rasmussen, MD 10/05/21 1029

## 2021-10-04 NOTE — Discharge Instructions (Signed)

## 2021-10-04 NOTE — ED Triage Notes (Signed)
Pt arrives pov with c/o left flank pain and left lower back pain with nausea x 3 weeks. Denies fever, denies injury, reports heavy lifting daily. Denies dysuria

## 2021-10-14 ENCOUNTER — Encounter: Payer: Self-pay | Admitting: Family Medicine

## 2021-10-14 ENCOUNTER — Other Ambulatory Visit: Payer: Self-pay

## 2021-10-14 ENCOUNTER — Ambulatory Visit: Payer: Self-pay | Attending: Family Medicine | Admitting: Family Medicine

## 2021-10-14 VITALS — BP 119/74 | HR 91 | Ht 78.0 in | Wt 264.8 lb

## 2021-10-14 DIAGNOSIS — M62838 Other muscle spasm: Secondary | ICD-10-CM

## 2021-10-14 MED ORDER — TIZANIDINE HCL 4 MG PO TABS
4.0000 mg | ORAL_TABLET | Freq: Three times a day (TID) | ORAL | 1 refills | Status: DC | PRN
Start: 2021-10-14 — End: 2022-06-16

## 2021-10-14 NOTE — Progress Notes (Signed)
Subjective:  Patient ID: Trevor Hopkins, male    DOB: [birth year]  Age: 35 y.o. MRN: [id number]  CC: Back Pain   HPI Trevor Hopkins is a 35 y.o. year old male who presents today to establish care. He had an ED visit on 10/04/2021 for flank pain which was diagnosed as muscle strain and treated with Robaxin and naproxen. CT renal stone study revealed: IMPRESSION: No acute intra-abdominal or intrapelvic abnormality with limited evaluation on this noncontrast study.  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 Symptoms have been present for the last 2 months. Past Medical History:  Diagnosis Date   Thyroid disease     No past surgical history on file.  Family History  Family history unknown: Yes    Allergies  Allergen Reactions   Penicillins Other (See Comments), Rash and Swelling    From childhood-all "cillins"    Outpatient Medications Prior to Visit  Medication Sig Dispense Refill   amLODipine (NORVASC) 2.5 MG tablet Take 1 tablet (2.5 mg total) by mouth daily. (Patient not taking: Reported on 10/14/2021) 30 tablet 2   ibuprofen (ADVIL,MOTRIN) 800 MG tablet Take 1 tablet (800 mg total) by mouth every 8 (eight) hours as needed. (Patient not taking: Reported on 10/14/2021) 30 tablet 0   methocarbamol (ROBAXIN) 500 MG tablet Take 1 tablet (500 mg total) by mouth 2 (two) times daily. (Patient not taking: Reported on 10/14/2021) 20 tablet 0   morphine (MSIR) 15 MG tablet Take 1 tablet (15 mg total) by mouth every 4 (four) hours as needed for severe pain. (Patient not taking: Reported on 10/14/2021) 6 tablet 0   No facility-administered medications prior to visit.     ROS Review of Systems  Constitutional:  Negative for activity change and appetite change.   HENT:  Negative for sinus pressure and sore throat.   Eyes:  Negative for visual disturbance.  Respiratory:  Negative for cough, chest tightness and shortness of breath.   Cardiovascular:  Negative for chest pain and leg swelling.  Gastrointestinal:  Negative for abdominal distention, abdominal pain, constipation and diarrhea.  Endocrine: Negative.   Genitourinary:  Negative for dysuria.  Musculoskeletal:  Positive for back pain. Negative for joint swelling and myalgias.  Skin:  Negative for rash.  Allergic/Immunologic: Negative.   Neurological:  Negative for weakness, light-headedness and numbness.  Psychiatric/Behavioral:  Negative for dysphoric mood and suicidal ideas.    Objective:  There were no vitals taken for this visit.  BP/Weight 10/04/2021 08/04/2021 07/23/2021  Systolic BP 151 150 141  Diastolic BP 82 85 83  Wt. (Lbs) 250 - -  BMI 28.89 - -      Physical Exam Constitutional:      Appearance: He is well-developed.  Cardiovascular:     Rate and Rhythm: Normal rate.     Heart sounds: Normal heart sounds. No murmur heard. Pulmonary:     Effort: Pulmonary effort is normal.     Breath sounds: Normal breath sounds. No wheezing or rales.  Chest:     Chest wall: No tenderness.  Abdominal:     General: Bowel sounds are normal. There is no distension.     Palpations: Abdomen is soft. There is no mass.     Tenderness: There  is no abdominal tenderness.  Musculoskeletal:        General: Tenderness (TTP of lumbar muscles on L lumbar region with associated stiffness of muscle) present. Normal range of motion.     Right lower leg: No edema.     Left lower leg: No edema.  Neurological:     Mental Status: He is alert and oriented to person, place, and time.  Psychiatric:        Mood and Affect: Mood normal.    CMP Latest Ref Rng & Units 10/04/2021 01/07/2012 05/26/2010  Glucose 70 - 99 mg/dL 120(H) 108(H) 93  BUN 6 - 20 mg/dL 11 12 11   Creatinine 0.61 - 1.24 mg/dL 0.73  1.00 0.95  Sodium 135 - 145 mmol/L 140 140 138  Potassium 3.5 - 5.1 mmol/L 3.7 4.4 3.7  Chloride 98 - 111 mmol/L 105 102 102  CO2 22 - 32 mmol/L 24 - 26  Calcium 8.9 - 10.3 mg/dL 9.4 - 8.5  Total Protein 6.5 - 8.1 g/dL 7.6 - -  Total Bilirubin 0.3 - 1.2 mg/dL 0.6 - -  Alkaline Phos 38 - 126 U/L 62 - -  AST 15 - 41 U/L 28 - -  ALT 0 - 44 U/L 22 - -    Lipid Panel  No results found for: CHOL, TRIG, HDL, CHOLHDL, VLDL, LDLCALC, LDLDIRECT  CBC    Component Value Date/Time   WBC 7.8 10/04/2021 1735   RBC 4.82 10/04/2021 1735   HGB 16.4 10/04/2021 1735   HCT 46.6 10/04/2021 1735   PLT 279 10/04/2021 1735   MCV 96.7 10/04/2021 1735   MCH 34.0 10/04/2021 1735   MCHC 35.2 10/04/2021 1735   RDW 11.9 10/04/2021 1735   LYMPHSABS 2.7 10/04/2021 1735   MONOABS 0.5 10/04/2021 1735   EOSABS 0.1 10/04/2021 1735   BASOSABS 0.1 10/04/2021 1735    No results found for: HGBA1C  Assessment & Plan:  1. Muscle spasm Advised to apply heat or ice whichever is tolerated to painful areas. Counseled on evidence of improvement in pain control with regards to yoga, water aerobics, massage, home physical therapy, exercise as tolerated. - Ambulatory referral to Physical Therapy - tiZANidine (ZANAFLEX) 4 MG tablet; Take 1 tablet (4 mg total) by mouth every 8 (eight) hours as needed for muscle spasms.  Dispense: 60 tablet; Refill: 1    No orders of the defined types were placed in this encounter.   Return in about 6 weeks (around 11/25/2021) for Low back pain.        Charlott Rakes, MD, FAAFP. [facility], [place]   10/14/2021, 4:23 PM

## 2021-10-14 NOTE — Patient Instructions (Signed)
Muscle Cramps and Spasms Muscle cramps and spasms are when muscles tighten by themselves. They usually get better within minutes. Muscle cramps are painful. They are usually stronger and last longer than muscle spasms. Muscle spasms may or may not be painful. They can last a few seconds or much longer. Cramps and spasms can affect any muscle, but they occur most often in the calf muscles of the leg. They are usually not caused by a serious problem. In many cases, the cause is not known. Some common causes include: Doing more physical work or exercise than your body is ready for. Using the muscles too much (overuse) by repeating certain movements too many times. Staying in a certain position for a long time. Playing a sport or doing an activity without preparing properly. Using bad form or technique while playing a sport or doing an activity. Not having enough water in your body (dehydration). Injury. Side effects of some medicines. Low levels of the salts and minerals in your blood (electrolytes), such as low potassium or calcium. Follow these instructions at home: Managing pain and stiffness   Massage, stretch, and relax the muscle. Do this for many minutes at a time. If told, put heat on tight or tense muscles as often as told by your doctor. Use the heat source that your doctor recommends, such as a moist heat pack or a heating pad. Place a towel between your skin and the heat source. Leave the heat on for 20-30 minutes. Remove the heat if your skin turns bright red. This is very important if you are not able to feel pain, heat, or cold. You may have a greater risk of getting burned. If told, put ice on the affected area. This may help if you are sore or have pain after a cramp or spasm. Put ice in a plastic bag. Place a towel between your skin and the bag. Leave the ice on for 20 minutes, 2-3 times a day. Try taking hot showers or baths to help relax tight muscles. Eating and  drinking Drink enough fluid to keep your pee (urine) pale yellow. Eat a healthy diet to help ensure that your muscles work well. This should include: Fruits and vegetables. Lean protein. Whole grains. Low-fat or nonfat dairy products. General instructions If you are having cramps often, avoid intense exercise for several days. Take over-the-counter and prescription medicines only as told by your doctor. Watch for any changes in your symptoms. Keep all follow-up visits as told by your doctor. This is important. Contact a doctor if: Your cramps or spasms get worse or happen more often. Your cramps or spasms do not get better with time. Summary Muscle cramps and spasms are when muscles tighten by themselves. They usually get better within minutes. Cramps and spasms occur most often in the calf muscles of the leg. Massage, stretch, and relax the muscle. This may help the cramp or spasm go away. Drink enough fluid to keep your pee (urine) pale yellow. This information is not intended to replace advice given to you by your health care provider. Make sure you discuss any questions you have with your health care provider. Document Revised: 05/16/2021 Document Reviewed: 05/16/2021 Elsevier Patient Education  2022 [employer]vier Inc.  

## 2021-11-26 ENCOUNTER — Ambulatory Visit: Payer: Self-pay | Admitting: Family Medicine

## 2021-12-18 ENCOUNTER — Other Ambulatory Visit: Payer: Self-pay

## 2021-12-18 ENCOUNTER — Encounter (HOSPITAL_BASED_OUTPATIENT_CLINIC_OR_DEPARTMENT_OTHER): Payer: Self-pay

## 2021-12-18 ENCOUNTER — Emergency Department (HOSPITAL_BASED_OUTPATIENT_CLINIC_OR_DEPARTMENT_OTHER)
Admission: EM | Admit: 2021-12-18 | Discharge: 2021-12-18 | Disposition: A | Discharge status: Home / Self Care | Payer: Self-pay | Attending: Emergency Medicine | Admitting: Emergency Medicine

## 2021-12-18 DIAGNOSIS — X500XXA Overexertion from strenuous movement or load, initial encounter: Secondary | ICD-10-CM | POA: Insufficient documentation

## 2021-12-18 DIAGNOSIS — Z79899 Other long term (current) drug therapy: Secondary | ICD-10-CM | POA: Insufficient documentation

## 2021-12-18 DIAGNOSIS — S29012A Strain of muscle and tendon of back wall of thorax, initial encounter: Secondary | ICD-10-CM | POA: Insufficient documentation

## 2021-12-18 MED ORDER — IBUPROFEN 800 MG PO TABS: 800.0000 mg | ORAL_TABLET | Freq: Three times a day (TID) | ORAL | 0 refills | Status: DC | PRN

## 2021-12-18 MED ORDER — METHOCARBAMOL 500 MG PO TABS: 500.0000 mg | ORAL_TABLET | Freq: Four times a day (QID) | ORAL | 0 refills | Status: DC | PRN

## 2021-12-18 NOTE — ED Provider Notes (Signed)
[facility] EMERGENCY DEPT Provider Note   CSN: [id number] Arrival date & time: 12/18/21  1330     History  Chief Complaint  Patient presents with   Back Pain    Trevor Hopkins is a 36 y.o. male.  The history is provided by the patient and medical records. No language interpreter was used.  Back Pain  36 year old male presenting with complaint of back pain.  Patient mention he usually has lower back pain due to heavy lifting.  And only takes over-the-counter medication with some improvement and using a TENS unit.  However several days ago after lifting heavy box while at work he felt something pop in his mid upper back follows with intense pain.  Pain has been an ongoing issue worse with movement.  Pain is nonradiating no associated chest pain shortness of breath productive cough or fever.  No specific injury.  No numbness or weakness.  Pain is moderate in severity at this time.  Home Medications Prior to Admission medications   Medication Sig Start Date End Date Taking? Authorizing Provider  amLODipine (NORVASC) 2.5 MG tablet Take 1 tablet (2.5 mg total) by mouth daily. Patient not taking: Reported on 10/14/2021 07/23/21 10/21/21  Theadora Rama Scales, PA-C  ibuprofen (ADVIL,MOTRIN) 800 MG tablet Take 1 tablet (800 mg total) by mouth every 8 (eight) hours as needed. Patient not taking: Reported on 10/14/2021 04/23/15   Beers, Charmayne Sheer, PA-C  methocarbamol (ROBAXIN) 500 MG tablet Take 1 tablet (500 mg total) by mouth 2 (two) times daily. Patient not taking: Reported on 10/14/2021 10/04/21   Couture, Cortni S, PA-C  morphine (MSIR) 15 MG tablet Take 1 tablet (15 mg total) by mouth every 4 (four) hours as needed for severe pain. Patient not taking: Reported on 10/14/2021 01/28/16   Arthor Captain, PA-C  tiZANidine (ZANAFLEX) 4 MG tablet Take 1 tablet (4 mg total) by mouth every 8 (eight) hours as needed for muscle spasms. 10/14/21   Hoy Register, MD       Allergies    Penicillins    Review of Systems   Review of Systems  Musculoskeletal:  Positive for back pain.  All other systems reviewed and are negative.  Physical Exam Updated Vital Signs BP 138/88    Pulse 71    Temp 97.8 F (36.6 C)    Resp 18    SpO2 100%  Physical Exam Vitals and nursing note reviewed.  Constitutional:      General: He is not in acute distress.    Appearance: He is well-developed.  HENT:     Head: Atraumatic.  Eyes:     Conjunctiva/sclera: Conjunctivae normal.  Cardiovascular:     Rate and Rhythm: Normal rate and regular rhythm.  Pulmonary:     Effort: Pulmonary effort is normal.     Breath sounds: Normal breath sounds. No wheezing, rhonchi or rales.  Abdominal:     Palpations: Abdomen is soft.  Musculoskeletal:        General: Tenderness (Tenderness along the thoracic paralumbar spinal muscles bilaterally without significant midline spine tenderness crepitus or step-off.  Full range of motion throughout upper back and lower back.) present.     Cervical back: Neck supple.  Skin:    Findings: No rash.  Neurological:     Mental Status: He is alert.    ED Results / Procedures / Treatments   Labs (all labs ordered are listed, but only abnormal results are displayed) Labs Reviewed - No data to display  EKG None  Radiology No results found.  Procedures Procedures    Medications Ordered in ED Medications - No data to display  ED Course/ Medical Decision Making/ A&P                           Medical Decision Making  BP 138/88    Pulse 71    Temp 97.8 F (36.6 C)    Resp 18    SpO2 100%   5:34 PM Patient here with atraumatic mid upper back pain from lifting heavy boxes.  Pain worsening with movement.  He does not have any midline spine tenderness on my exam.  Pain is more noticeable to the thoracic para spinal muscle.  No overlying skin changes to suggest infection.  His lungs are clear no cough doubt pneumonia or dissection.  He is  afebrile, vital signs stable, no hypoxia.  Suspect MSK pain, will provide anti-inflammatory occasion and muscle relaxant as needed, orthopedic referral given as needed as well.  Return precaution given.  Patient voiced understanding and agrees with plan.  He ambulates without difficulty.  Work note provided per request.  I have considered x-ray of thoracic spine but due to lack of direct trauma I felt x-ray is low yield and patient agrees.  I have considered labs, and viral respiratory panel but patient is without any infectious symptoms.        Final Clinical Impression(s) / ED Diagnoses Final diagnoses:  Strain of mid-back, initial encounter    Rx / DC Orders ED Discharge Orders          Ordered    ibuprofen (ADVIL) 800 MG tablet  Every 8 hours PRN        12/18/21 1736    methocarbamol (ROBAXIN) 500 MG tablet  Every 6 hours PRN        12/18/21 1736              Fayrene Helper, PA-C 12/18/21 1738    Benjiman Core, MD 12/19/21 0028

## 2021-12-18 NOTE — ED Triage Notes (Signed)
Pt presents with upper back pian x2 days ago. Pt reports the pain started 2 days ago after picking up a box at work, states "felt like it popped"

## 2022-04-16 IMAGING — CR DG KNEE COMPLETE 4+V*R*
4 series · 4 of 4 positions shown · non-contrast
Comparison: None.

CLINICAL DATA: Right knee pain with history of injury

EXAM:
RIGHT KNEE - COMPLETE 4 VIEW

[knee ap]
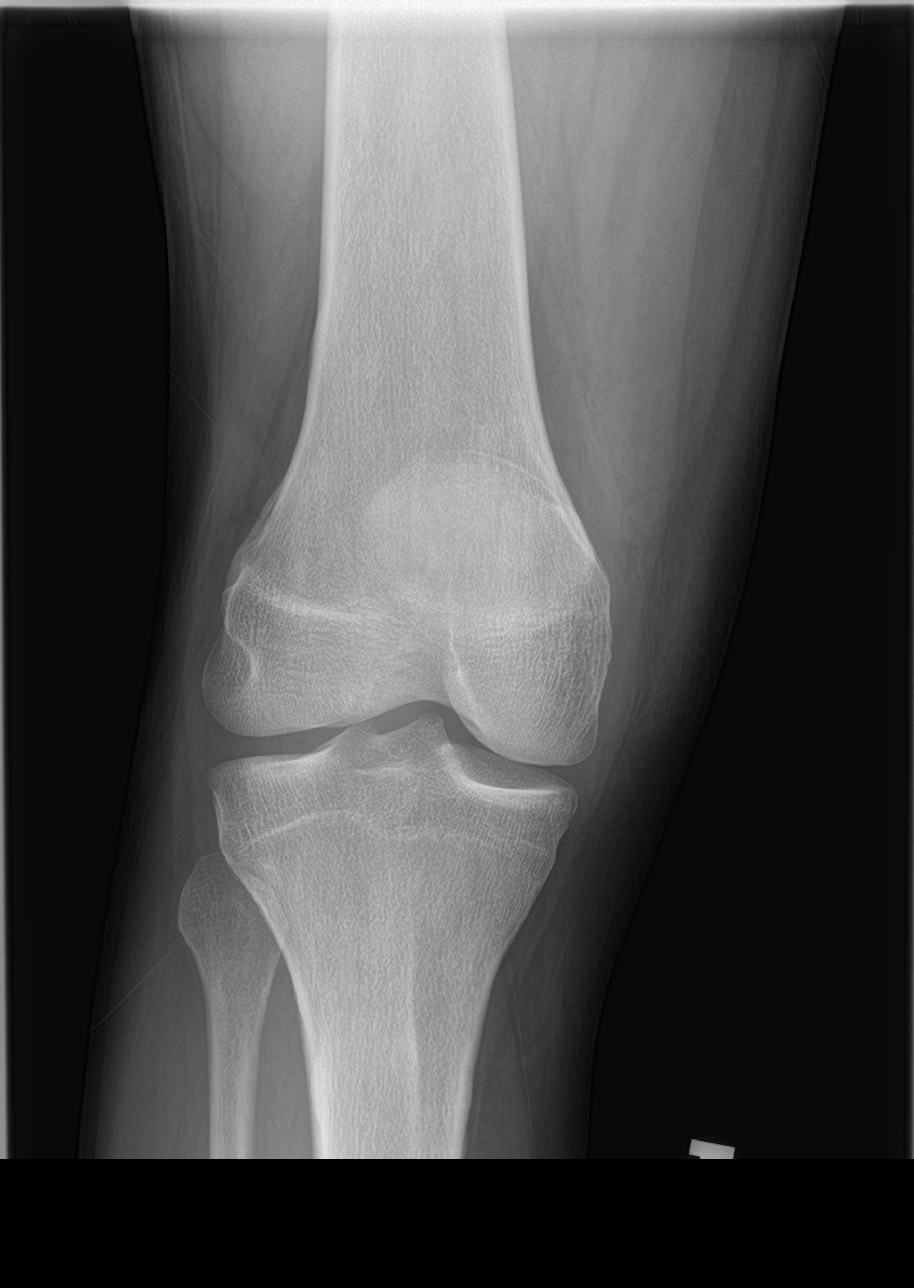

[knee lat]
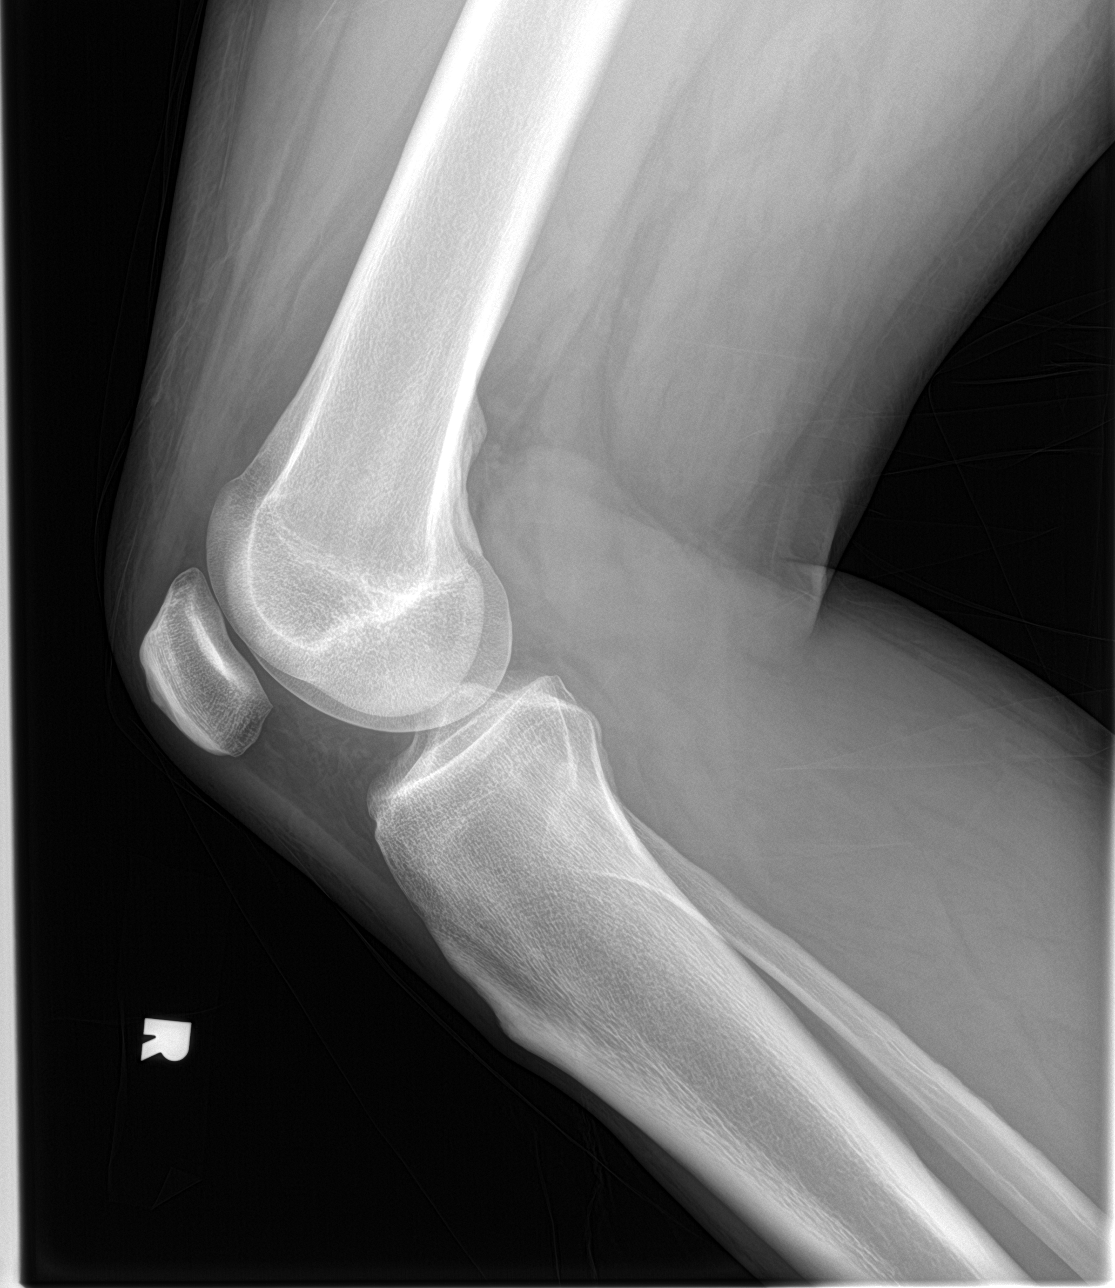

[knee obl (1 of 2)]
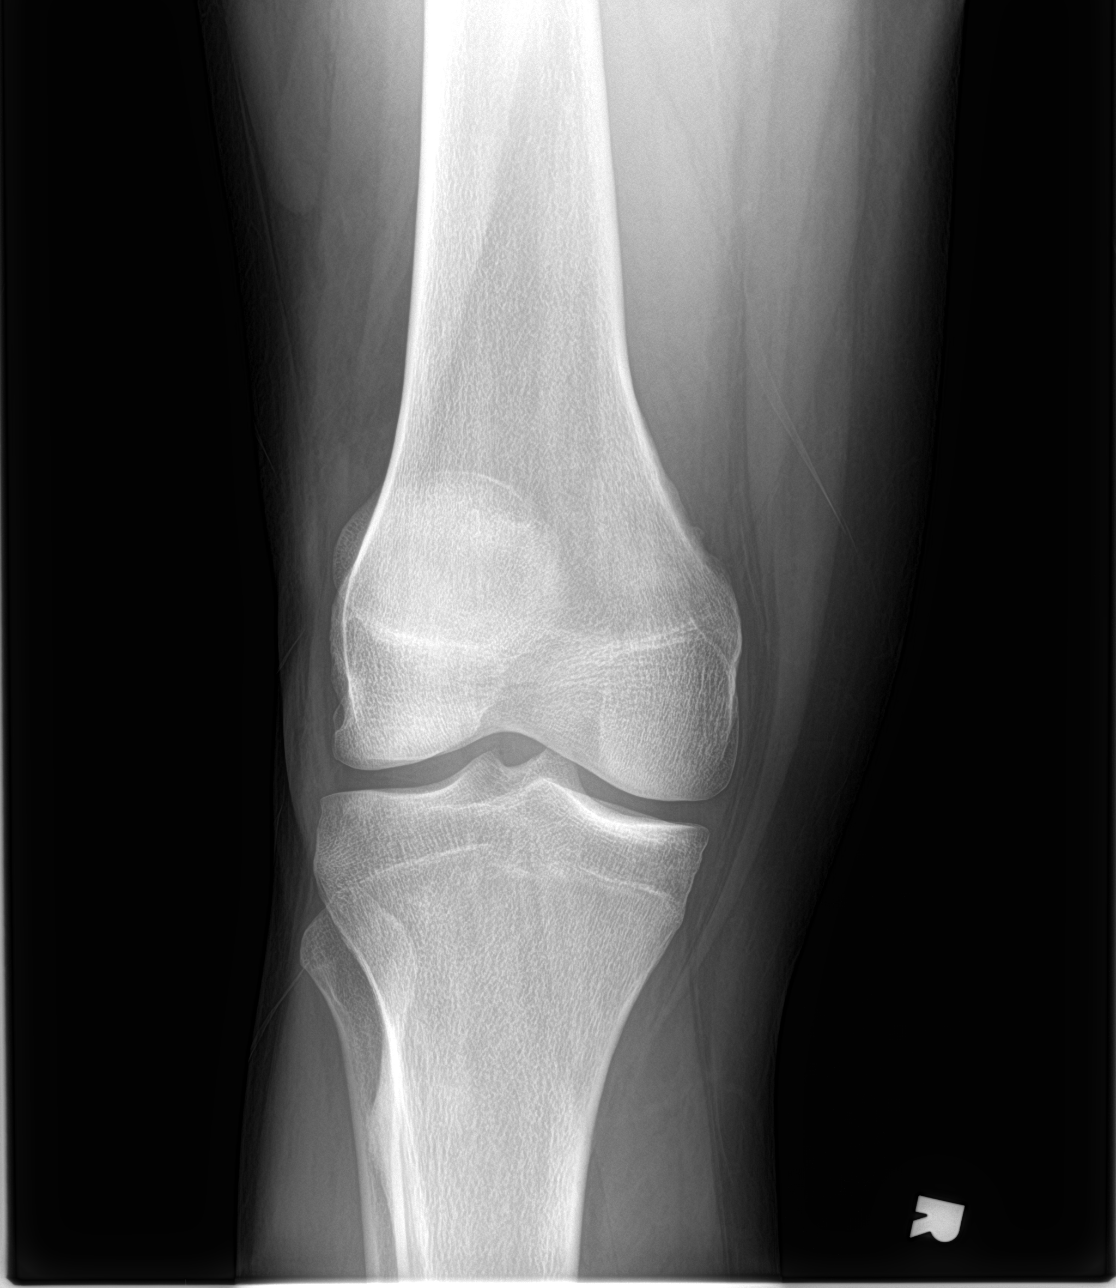

[knee obl (2 of 2)]
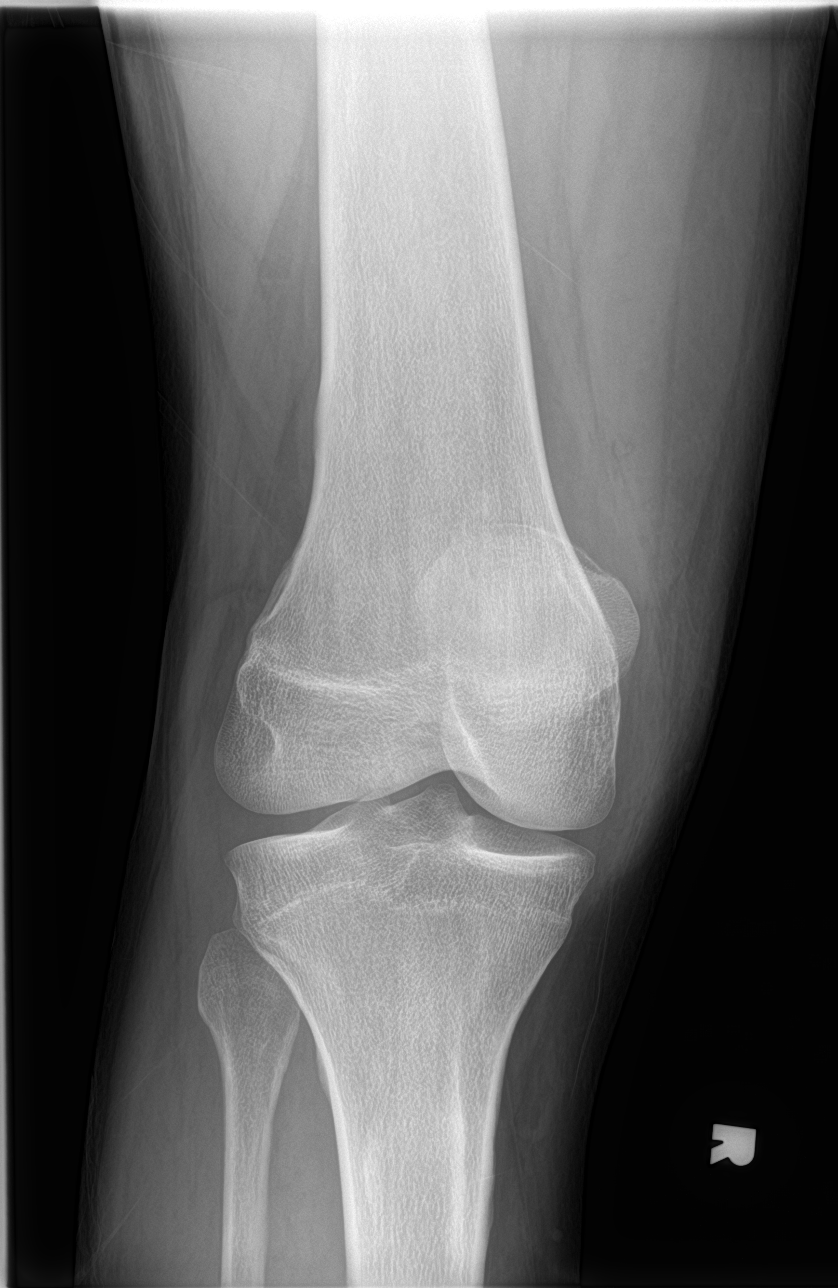

[4 of 4 positions shown; findings below may reference images not displayed]

FINDINGS: No acute fracture or dislocation. Trace joint effusion. No evidence
of arthropathy or other focal bone abnormality. Soft tissues are
unremarkable.
IMPRESSION: No acute osseous abnormality.

## 2022-06-16 ENCOUNTER — Encounter: Payer: Self-pay | Admitting: Emergency Medicine

## 2022-06-16 ENCOUNTER — Emergency Department: Payer: Self-pay

## 2022-06-16 ENCOUNTER — Emergency Department
Admission: EM | Admit: 2022-06-16 | Discharge: 2022-06-16 | Disposition: A | Discharge status: Home / Self Care | Payer: Self-pay | Attending: Emergency Medicine | Admitting: Emergency Medicine

## 2022-06-16 DIAGNOSIS — M545 Low back pain, unspecified: Secondary | ICD-10-CM | POA: Insufficient documentation

## 2022-06-16 DIAGNOSIS — G8929 Other chronic pain: Secondary | ICD-10-CM

## 2022-06-16 DIAGNOSIS — K760 Fatty (change of) liver, not elsewhere classified: Secondary | ICD-10-CM | POA: Insufficient documentation

## 2022-06-16 DIAGNOSIS — R300 Dysuria: Secondary | ICD-10-CM | POA: Insufficient documentation

## 2022-06-16 DIAGNOSIS — R7401 Elevation of levels of liver transaminase levels: Secondary | ICD-10-CM | POA: Insufficient documentation

## 2022-06-16 LAB — COMPREHENSIVE METABOLIC PANEL
ALT: 66 U/L — ABNORMAL HIGH (ref 0–44)
AST: 67 U/L — ABNORMAL HIGH (ref 15–41)
Albumin: 4.4 g/dL (ref 3.5–5.0)
Alkaline Phosphatase: 65 U/L (ref 38–126)
Anion gap: 12 (ref 5–15)
BUN: 8 mg/dL (ref 6–20)
CO2: 23 mmol/L (ref 22–32)
Calcium: 8.8 mg/dL — ABNORMAL LOW (ref 8.9–10.3)
Chloride: 105 mmol/L (ref 98–111)
Creatinine, Ser: 0.73 mg/dL (ref 0.61–1.24)
GFR, Estimated: >60 mL/min (ref >60)
Glucose, Bld: 121 mg/dL — ABNORMAL HIGH (ref 70–99)
Potassium: 3.2 mmol/L — ABNORMAL LOW (ref 3.5–5.1)
Sodium: 140 mmol/L (ref 135–145)
Total Bilirubin: 0.9 mg/dL (ref 0.3–1.2)
Total Protein: 8.6 g/dL — ABNORMAL HIGH (ref 6.5–8.1)

## 2022-06-16 LAB — URINALYSIS, ROUTINE W REFLEX MICROSCOPIC
Bilirubin Urine: NEGATIVE
Glucose, UA: NEGATIVE mg/dL
Ketones, ur: NEGATIVE mg/dL
Leukocytes,Ua: NEGATIVE
Nitrite: NEGATIVE
Protein, ur: NEGATIVE mg/dL
Specific Gravity, Urine: 1.011 (ref 1.005–1.030)
pH: 5 (ref 5.0–8.0)

## 2022-06-16 LAB — CBC WITH DIFFERENTIAL/PLATELET
Abs Immature Granulocytes: 0.01 10*3/uL (ref 0.00–0.07)
Basophils Absolute: 0.1 10*3/uL (ref 0.0–0.1)
Basophils Relative: 1 %
Eosinophils Absolute: 0.1 10*3/uL (ref 0.0–0.5)
Eosinophils Relative: 2 %
HCT: 47.2 % (ref 39.0–52.0)
Hemoglobin: 16.7 g/dL (ref 13.0–17.0)
Immature Granulocytes: 0 %
Lymphocytes Relative: 38 %
Lymphs Abs: 2 10*3/uL (ref 0.7–4.0)
MCH: 35 pg — ABNORMAL HIGH (ref 26.0–34.0)
MCHC: 35.4 g/dL (ref 30.0–36.0)
MCV: 99 fL (ref 80.0–100.0)
Monocytes Absolute: 0.4 10*3/uL (ref 0.1–1.0)
Monocytes Relative: 7 %
Neutro Abs: 2.8 10*3/uL (ref 1.7–7.7)
Neutrophils Relative %: 52 %
Platelets: 253 10*3/uL (ref 150–400)
RBC: 4.77 MIL/uL (ref 4.22–5.81)
RDW: 12.1 % (ref 11.5–15.5)
WBC: 5.4 10*3/uL (ref 4.0–10.5)
nRBC: 0 % (ref 0.0–0.2)

## 2022-06-16 LAB — CHLAMYDIA/NGC RT PCR (ARMC ONLY)
Chlamydia Tr: NOT DETECTED
N gonorrhoeae: NOT DETECTED

## 2022-06-16 MED ORDER — KETOROLAC TROMETHAMINE 30 MG/ML IJ SOLN
30.0000 mg | Freq: Once | INTRAMUSCULAR | Status: AC
  Administered 2022-06-16: 30 mg via INTRAVENOUS
  Filled 2022-06-16: qty 1

## 2022-06-16 MED ORDER — IBUPROFEN 800 MG PO TABS: 800.0000 mg | ORAL_TABLET | Freq: Three times a day (TID) | ORAL | 0 refills | Status: DC | PRN

## 2022-06-16 MED ORDER — LIDOCAINE 5 % EX PTCH: 1.0000 | MEDICATED_PATCH | CUTANEOUS | 0 refills | Status: AC

## 2022-06-16 MED ORDER — ONDANSETRON HCL 4 MG/2ML IJ SOLN
4.0000 mg | Freq: Once | INTRAMUSCULAR | Status: AC
  Administered 2022-06-16: 4 mg via INTRAVENOUS
  Filled 2022-06-16: qty 2

## 2022-06-16 MED ORDER — SODIUM CHLORIDE 0.9 % IV BOLUS (SEPSIS)
1000.0000 mL | Freq: Once | INTRAVENOUS | Status: AC
  Administered 2022-06-16: 1000 mL via INTRAVENOUS
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
## 2022-06-16 NOTE — ED Notes (Addendum)
Forensic blood draw preformed at 0407 by this tech with [employer] in the rm. I put gloves on and then a tourniquet was placed on pts left arm above antecubital. Left antecubital was cleaned with a povidone-iodine wipe. I waited until the povidone-iodine dried before I performed the venipuncture with the straight stick needle that was inside the box the that [employer] provided me with. Two tubes of blood was collected, handed to [employer] while he was wearing gloves and labeled. Blood samples were then placed into the biohazard bag and placed back inside the box.
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
## 2022-06-16 NOTE — Discharge Instructions (Addendum)
I recommend avoiding Tylenol and alcohol at this time as your liver function tests were minimally elevated.  You will need to have your primary care doctor recheck these in a month.  I recommend eating a low-fat diet as well.  Your other lab work, urine and CT scan today were reassuring.  Tests for gonorrhea and chlamydia are pending.  If you are positive for either of these, you will be contacted for treatment.  You may follow-up on these results through MyChart.

## 2024-05-24 ENCOUNTER — Emergency Department (HOSPITAL_COMMUNITY)
Admission: EM | Admit: 2024-05-24 | Discharge: 2024-05-24 | Disposition: A | Discharge status: Home / Self Care | Payer: Self-pay | Attending: Emergency Medicine | Admitting: Emergency Medicine

## 2024-05-24 ENCOUNTER — Other Ambulatory Visit: Payer: Self-pay

## 2024-05-24 ENCOUNTER — Encounter (HOSPITAL_COMMUNITY): Payer: Self-pay | Admitting: Pharmacy Technician

## 2024-05-24 DIAGNOSIS — Z720 Tobacco use: Secondary | ICD-10-CM | POA: Diagnosis not present

## 2024-05-24 DIAGNOSIS — M5410 Radiculopathy, site unspecified: Secondary | ICD-10-CM

## 2024-05-24 DIAGNOSIS — M545 Low back pain, unspecified: Secondary | ICD-10-CM | POA: Diagnosis present

## 2024-05-24 DIAGNOSIS — M5416 Radiculopathy, lumbar region: Secondary | ICD-10-CM | POA: Insufficient documentation

## 2024-05-24 MED ORDER — PREDNISONE 20 MG PO TABS: ORAL_TABLET | ORAL | 0 refills | Status: AC

## 2024-05-24 MED ORDER — CYCLOBENZAPRINE HCL 10 MG PO TABS: 10.0000 mg | ORAL_TABLET | Freq: Two times a day (BID) | ORAL | 0 refills | Status: AC | PRN

## 2024-05-24 MED ORDER — KETOROLAC TROMETHAMINE 30 MG/ML IJ SOLN
30.0000 mg | Freq: Once | INTRAMUSCULAR | Status: AC
  Administered 2024-05-24: 30 mg via INTRAMUSCULAR
  Filled 2024-05-24: qty 1

## 2024-05-24 MED ORDER — IBUPROFEN 600 MG PO TABS: 600.0000 mg | ORAL_TABLET | Freq: Four times a day (QID) | ORAL | 0 refills | Status: AC | PRN

## 2024-05-24 MED ORDER — DIAZEPAM 5 MG PO TABS
5.0000 mg | ORAL_TABLET | Freq: Once | ORAL | Status: AC
  Administered 2024-05-24: 5 mg via ORAL
  Filled 2024-05-24: qty 1

## 2024-05-24 MED ORDER — DEXAMETHASONE SODIUM PHOSPHATE 10 MG/ML IJ SOLN
10.0000 mg | Freq: Once | INTRAMUSCULAR | Status: AC
  Administered 2024-05-24: 10 mg via INTRAMUSCULAR
  Filled 2024-05-24: qty 1

## 2024-05-24 MED ORDER — OXYCODONE-ACETAMINOPHEN 5-325 MG PO TABS
1.0000 | ORAL_TABLET | Freq: Once | ORAL | Status: AC
  Administered 2024-05-24: 1 via ORAL
  Filled 2024-05-24: qty 1

## 2024-05-24 NOTE — ED Provider Notes (Signed)
 [employer] EMERGENCY DEPARTMENT AT [facility] Provider Note   CSN: [id number] Arrival date & time: 05/24/24  9083     Patient presents with: Back Pain   Trevor Hopkins is a 38 y.o. male.   The history is provided by the patient, the spouse and medical records. No language interpreter was used.  Back Pain    38 year old male brought here via EMS from home for evaluation of back pain.  Yesterday patient was lifting a heavy box and shortly after that he developed pain to his back.  Pain is primarily to the left side of his back radiates down to his left thigh described as a sharp shooting sensation with tightness across his back.  Pain worsened with movement.  No fever no chills no dysuria no hematuria no bowel bladder incontinence or saddle esthesia.  No history of IV drug use or active cancer.  He has had problem with his back in the past.  Prior to Admission medications   Medication Sig Start Date End Date Taking? Authorizing Provider  ibuprofen  (ADVIL ) 800 MG tablet Take 1 tablet (800 mg total) by mouth every 8 (eight) hours as needed. 06/16/22   Ward, Josette SAILOR, DO    Allergies: Penicillins    Review of Systems  Musculoskeletal:  Positive for back pain.    Updated Vital Signs BP 127/76 (BP Location: Left Arm)   Pulse 75   Temp 97.9 F (36.6 C)   Resp (!) 25   SpO2 93%   Physical Exam Constitutional:      General: He is not in acute distress.    Appearance: He is well-developed.  HENT:     Head: Atraumatic.  Eyes:     Conjunctiva/sclera: Conjunctivae normal.  Abdominal:     Palpations: Abdomen is soft.     Tenderness: There is no abdominal tenderness.  Musculoskeletal:        General: Tenderness (Tenderness to left lumbar paraspinal region on palpation with positive left straight leg raise.) present.     Cervical back: Normal range of motion and neck supple.  Skin:    Capillary Refill: Capillary refill takes less than 2 seconds.     Findings: No  rash.  Neurological:     Mental Status: He is alert.     Comments: Able to ambulate with an antalgic gait.     (all labs ordered are listed, but only abnormal results are displayed) Labs Reviewed  URINALYSIS, ROUTINE W REFLEX MICROSCOPIC    EKG: None  Radiology: No results found.   Procedures   Medications Ordered in the ED  diazepam  (VALIUM ) tablet 5 mg (5 mg Oral Given 05/24/24 0930)  oxyCODONE -acetaminophen  (PERCOCET/ROXICET) 5-325 MG per tablet 1 tablet (1 tablet Oral Given 05/24/24 0930)  ketorolac  (TORADOL ) 30 MG/ML injection 30 mg (30 mg Intramuscular Given 05/24/24 0930)  dexamethasone  (DECADRON ) injection 10 mg (10 mg Intramuscular Given 05/24/24 0931)                                    Medical Decision Making  BP 127/76 (BP Location: Left Arm)   Pulse 75   Temp 97.9 F (36.6 C)   Resp (!) 25   SpO2 93%   66:62 PM  38 year old male brought here via EMS from home for evaluation of back pain.  Yesterday patient was lifting a heavy box and shortly after that he developed pain to his  back.  Pain is primarily to the left side of his back radiates down to his left thigh described as a sharp shooting sensation with tightness across his back.  Pain worsened with movement.  No fever no chills no dysuria no hematuria no bowel bladder incontinence or saddle esthesia.  No history of IV drug use or active cancer.  He has had problem with his back in the past.  On exam, patient is sitting in bed in no significant discomfort.  He was able to stand up on his own and able to walk a short distance with some antalgic gait.  He does have tenderness to palpation of his left lumbar paraspinal muscle with positive straight leg raise.  Suspect his symptom is likely a muscle strain versus sciatica.  I have low suspicion for infectious etiology such as epidural abscess or discitis as patient is without any red flags.  I doubt this is kidney stone or aortic dissection.  Doubt cauda  equina.  Treatment received including Decadron , Valium , Toradol , and Percocet with improvement of symptoms.  Will discharge home with supportive care and orthopedic referral along with work note.  Return precaution discussed.  Advanced imaging including MRI of the lumbar spine considered but not performed as patient able to ambulate.  Social determinant of health including tobacco use and depression.     Final diagnoses:  Radicular leg pain    ED Discharge Orders          Ordered    predniSONE  (DELTASONE ) 20 MG tablet        05/24/24 1429    ibuprofen  (ADVIL ) 600 MG tablet  Every 6 hours PRN        05/24/24 1429    cyclobenzaprine  (FLEXERIL ) 10 MG tablet  2 times daily PRN        05/24/24 1429               Nivia Colon, PA-C 05/24/24 1430    Doretha Folks, MD 05/28/24 2216

## 2024-05-24 NOTE — ED Provider Triage Note (Signed)
 Emergency Medicine Provider Triage Evaluation Note  Trevor Hopkins [place] , a 38 y.o. male  was evaluated in triage.  Pt complains of back pain.  Pt said he was lifting some heavy boxes yesterday and felt a pain in his back.  He has pain radiating down his left leg.  No red flag sx.  His family had to help him to the bathroom this morning because he could not get out of bed.  He did take tylenol  pta.   Review of Systems  Positive: +pain, left leg radiation Negative: incontinence  Physical Exam  BP (!) 140/78 (BP Location: Left Arm)   Pulse 84   Temp 97.6 F (36.4 C) (Oral)   Resp 20   SpO2 95%  Gen:   Awake, no distress   Resp:  Normal effort  MSK:   +str leg raise on left Other:     Medical Decision Making  Medically screening exam initiated at 9:28 AM.  Appropriate orders placed.  [facility] [employer] was informed that the remainder of the evaluation will be completed by another provider, this initial triage assessment does not replace that evaluation, and the importance of remaining in the ED until their evaluation is complete.  Pt given valium , percocet, toradol  and decadron  prior to going to the [facility].   Dean Clarity, MD 05/24/24 0930

## 2024-05-24 NOTE — ED Triage Notes (Signed)
 Pt bib ptar with complaints of lifting heavy boxes yesterday. 5 minutes after he was done lifting the boxes developed L lower back pain radiating down his leg. Describes the pain as a pinching sensation.
